# Patient Record
Sex: Male | Born: 1935 | Race: White | Hispanic: No | Marital: Married | Smoking: Former smoker
Health system: Southern US, Community
[De-identification: ages and names within clinical notes are randomized; demographics above are authoritative.]

## PROBLEM LIST (undated history)

## (undated) DIAGNOSIS — C801 Malignant (primary) neoplasm, unspecified: Secondary | ICD-10-CM

## (undated) DIAGNOSIS — I10 Essential (primary) hypertension: Secondary | ICD-10-CM

## (undated) DIAGNOSIS — H9190 Unspecified hearing loss, unspecified ear: Secondary | ICD-10-CM

## (undated) DIAGNOSIS — G259 Extrapyramidal and movement disorder, unspecified: Secondary | ICD-10-CM

## (undated) DIAGNOSIS — H539 Unspecified visual disturbance: Secondary | ICD-10-CM

## (undated) DIAGNOSIS — E785 Hyperlipidemia, unspecified: Secondary | ICD-10-CM

## (undated) DIAGNOSIS — I639 Cerebral infarction, unspecified: Secondary | ICD-10-CM

## (undated) HISTORY — DX: Unspecified hearing loss, unspecified ear: H91.90

## (undated) HISTORY — DX: Extrapyramidal and movement disorder, unspecified: G25.9

## (undated) HISTORY — PX: AORTA SURGERY: SHX548

## (undated) HISTORY — DX: Unspecified visual disturbance: H53.9

## (undated) HISTORY — PX: HERNIA REPAIR: SHX51

---

## 2013-09-12 ENCOUNTER — Emergency Department (INDEPENDENT_AMBULATORY_CARE_PROVIDER_SITE_OTHER): Payer: Medicare HMO

## 2013-09-12 ENCOUNTER — Emergency Department
Admission: EM | Admit: 2013-09-12 | Discharge: 2013-09-12 | Disposition: A | Discharge status: Home / Self Care | Payer: Medicare HMO | Source: Home / Self Care | Attending: Family Medicine | Admitting: Family Medicine

## 2013-09-12 ENCOUNTER — Ambulatory Visit (INDEPENDENT_AMBULATORY_CARE_PROVIDER_SITE_OTHER): Payer: Medicare HMO | Admitting: Sports Medicine

## 2013-09-12 ENCOUNTER — Encounter: Payer: Self-pay | Admitting: Emergency Medicine

## 2013-09-12 DIAGNOSIS — M1711 Unilateral primary osteoarthritis, right knee: Secondary | ICD-10-CM | POA: Insufficient documentation

## 2013-09-12 DIAGNOSIS — S8391XA Sprain of unspecified site of right knee, initial encounter: Secondary | ICD-10-CM

## 2013-09-12 DIAGNOSIS — M1710 Unilateral primary osteoarthritis, unspecified knee: Secondary | ICD-10-CM

## 2013-09-12 DIAGNOSIS — M25469 Effusion, unspecified knee: Secondary | ICD-10-CM

## 2013-09-12 DIAGNOSIS — IMO0002 Reserved for concepts with insufficient information to code with codable children: Secondary | ICD-10-CM

## 2013-09-12 DIAGNOSIS — M7660 Achilles tendinitis, unspecified leg: Secondary | ICD-10-CM

## 2013-09-12 DIAGNOSIS — M7662 Achilles tendinitis, left leg: Secondary | ICD-10-CM

## 2013-09-12 DIAGNOSIS — M79609 Pain in unspecified limb: Secondary | ICD-10-CM

## 2013-09-12 HISTORY — DX: Hyperlipidemia, unspecified: E78.5

## 2013-09-12 HISTORY — DX: Malignant (primary) neoplasm, unspecified: C80.1

## 2013-09-12 HISTORY — DX: Cerebral infarction, unspecified: I63.9

## 2013-09-12 HISTORY — DX: Essential (primary) hypertension: I10

## 2013-09-12 MED ORDER — DICLOFENAC SODIUM 1 % TD GEL: 4.0000 g | Freq: Four times a day (QID) | TRANSDERMAL | Status: DC

## 2013-09-12 NOTE — ED Provider Notes (Signed)
CSN: [id number]     Arrival date & time 09/12/13  1107 History   First MD Initiated Contact with Patient 09/12/13 1125     Chief Complaint  Patient presents with  . Leg Pain      HPI Comments: Patient lost his balance arising from a chair two weeks ago, falling and twisting his right knee.  He has had persistent right knee pain despite wearing an elastic knee sleeve.  He has pain walking and climbing in/out of a car. One week ago his left foot slipped and he injured his left achilles tendon.  He has persistent pain in his posterior heel.  Patient is a 80 y.o. male presenting with knee pain and ankle pain. The history is provided by the patient and the spouse.  Knee Pain Location:  Knee Time since incident:  2 weeks Injury: yes   Mechanism of injury: fall   Fall:    Fall occurred: from a chair.   Impact surface:  Hard floor   Point of impact: right anterior knee. Knee location:  R knee Pain details:    Quality:  Aching   Radiates to:  Does not radiate   Severity:  Moderate   Onset quality:  Sudden   Duration:  2 weeks   Timing:  Constant   Progression:  Unchanged Chronicity:  New Dislocation: no   Prior injury to area:  No Worsened by:  Bearing weight Ineffective treatments: elastic knee sleeve. Associated symptoms: decreased ROM and stiffness   Associated symptoms: no back pain, no muscle weakness, no numbness and no swelling   Ankle Pain Lower extremity pain location: right posterior heel and achilles tendon. Time since incident:  1 week Injury: yes   Mechanism of injury comment:  Left foot slipped on ground Pain details:    Quality:  Aching   Radiates to: posterior left calf.   Severity:  Mild   Onset quality:  Sudden   Duration:  1 week   Timing:  Constant   Progression:  Unchanged Chronicity:  New Prior injury to area:  No Associated symptoms: decreased ROM and stiffness   Associated symptoms: no back pain, no muscle weakness, no numbness and no swelling      Past Medical History  Diagnosis Date  . Hypertension   . Hyperlipidemia   . Stroke   . Cancer    History reviewed. No pertinent past surgical history. Family History  Problem Relation Age of Onset  . Heart attack Father    History  Substance Use Topics  . Smoking status: Current Every Day Smoker -- 0.50 packs/day for 70 years    Types: Cigarettes  . Smokeless tobacco: Not on file  . Alcohol Use: Yes    Review of Systems  Musculoskeletal: Positive for stiffness. Negative for back pain.  All other systems reviewed and are negative.    Allergies  Review of patient's allergies indicates no known allergies.  Home Medications   Current Outpatient Rx  Name  Route  Sig  Dispense  Refill  . amLODipine (NORVASC) 5 MG tablet   Oral   Take 5 mg by mouth daily.         Marland Kitchen buPROPion (WELLBUTRIN) 100 MG tablet   Oral   Take 100 mg by mouth 2 (two) times daily.         Marland Kitchen lisinopril (PRINIVIL,ZESTRIL) 10 MG tablet   Oral   Take 10 mg by mouth daily.         Marland Kitchen  rosuvastatin (CRESTOR) 10 MG tablet   Oral   Take 10 mg by mouth daily.         . diclofenac sodium (VOLTAREN) 1 % GEL   Topical   Apply 4 g topically 4 (four) times daily. To affected joint.   400 g   11    BP 129/76  Pulse 92  Temp(Src) 97.4 F (36.3 C) (Oral)  Ht 5\' 7"  (1.702 m)  Wt 146 lb (66.225 kg)  BMI 22.86 kg/m2  SpO2 98% Physical Exam  Nursing note and vitals reviewed. Constitutional: He is oriented to person, place, and time. He appears well-developed and well-nourished. No distress.  HENT:  Head: Atraumatic.  Eyes: Conjunctivae are normal. Pupils are equal, round, and reactive to light.  Musculoskeletal:       Right knee: He exhibits swelling and bony tenderness. He exhibits normal range of motion, no effusion, no ecchymosis, no deformity, no laceration, no erythema, normal alignment, no LCL laxity, normal patellar mobility, normal meniscus and no MCL laxity. Tenderness found.  Lateral joint line tenderness noted.       Right ankle: He exhibits decreased range of motion. He exhibits no swelling, no ecchymosis, no deformity, no laceration and normal pulse. Tenderness. Achilles tendon exhibits pain.       Legs:      Feet:  Left ankle reveals tenderness to palpation over insertion of achilles tendon but no swelling or ecchymosis.  Achilles tendon is intact.  There is mild tenderness with palpation of the proximal soleus muscle.  Right knee has mild tenderness over the lateral joint line and MCL  Neurological: He is alert and oriented to person, place, and time.  Skin: Skin is warm and dry.    ED Course  Procedures      Imaging Review       Imaging Results        DG Knee Complete 4 Views Right (Final result)  Result time: 09/12/13 12:13:46    Final result by Rad Results In Interface (09/12/13 12:13:46)    Narrative:   CLINICAL DATA: Pain and tenderness right knee laterally, fell and twisted right knee 2 weeks ago  EXAM: RIGHT KNEE - COMPLETE 4+ VIEW  COMPARISON: None  FINDINGS: Mild osseous demineralization.  Diffuse joint space narrowing greatest at patellofemoral joint.  Small spur at cranial margin of patella.  Small knee joint effusion.  No acute fracture, dislocation, or bone destruction.  Scattered atherosclerotic calcifications.  IMPRESSION: Osseous demineralization with mild degenerative changes and a small knee joint effusion.  No acute abnormalities.   Electronically Signed By: Lavonia Dana M.D. On: 09/12/2013 12:13             DG Os Calcis Left (Final result)  Result time: 09/12/13 12:10:39    Final result by Rad Results In Interface (09/12/13 12:10:39)    Narrative:   CLINICAL DATA: Fall. Posterior heel pain.  EXAM: LEFT OS CALCIS - 2+ VIEW  COMPARISON: No comparison studies available.  FINDINGS: Two view exam of the calcaneus shows no fracture. There is no worrisome lytic or sclerotic osseous  abnormality. Overlying soft tissues are unremarkable.  IMPRESSION: No acute bony abnormality.   Electronically Signed By: Misty Stanley M.D. On: 09/12/2013 12:10        MDM   1. Right knee sprain   2. Right knee DJD   3. Tendonitis, Achilles, left   Patient would benefit for corticosteroid injection right knee.  Will refer to Dr. Aundria Mems for management, and  also treatment of his achilles tendonitis.    Kandra Nicolas, MD 09/16/13 1024

## 2013-09-12 NOTE — ED Notes (Signed)
Rt knee pain, left achilles tendon pain from a fall 2 weeks ago, now both legs are hurting all the time

## 2013-09-12 NOTE — Assessment & Plan Note (Signed)
Aspiration of gross hemarthrosis and injection as above. Voltaren gel. Return to see me in one month.

## 2013-09-12 NOTE — Progress Notes (Signed)
   Subjective:    I'm seeing this patient as a consultation for:  Dr. Assunta Found  CC: Right knee pain  HPI: This is a very pleasant 80 year old male, 2 weeks ago he took a misstep, twisted his right knee and developed immediate pain, swelling, and inability to bear weight. He was seen in urgent care, noted to have a moderate effusion, degenerative changes on x-ray I was called for further evaluation and definitive treatment. End is moderate, persistent, localized to the medial joint line, no mechanical symptoms.  Past medical history, Surgical history, Family history not pertinant except as noted below, Social history, Allergies, and medications have been entered into the medical record, reviewed, and no changes needed.   Review of Systems: No headache, visual changes, nausea, vomiting, diarrhea, constipation, dizziness, abdominal pain, skin rash, fevers, chills, night sweats, weight loss, swollen lymph nodes, body aches, joint swelling, muscle aches, chest pain, shortness of breath, mood changes, visual or auditory hallucinations.   Objective:   General: Well Developed, well nourished, and in no acute distress.  Neuro/Psych: Alert and oriented x3, extra-ocular muscles intact, able to move all 4 extremities, sensation grossly intact. Skin: Warm and dry, no rashes noted.  Respiratory: Not using accessory muscles, speaking in full sentences, trachea midline.  Cardiovascular: Pulses palpable, no extremity edema. Abdomen: Does not appear distended. Right Knee: There is discrete tenderness to palpation of the medial joint line with a palpable moderate effusion.  ROM full in flexion and extension and lower leg rotation. Ligaments with solid consistent endpoints including ACL, PCL, LCL, MCL. Negative Mcmurray's, Apley's, and Thessalonian tests. Non painful patellar compression. Patellar glide without crepitus. Patellar and quadriceps tendons unremarkable. Hamstring and quadriceps strength is normal.    Procedure: Real-time Ultrasound Guided aspiration/ Injection of right knee Device: GE Logiq E  Verbal informed consent obtained.  Time-out conducted.  Noted no overlying erythema, induration, or other signs of local infection.  Skin prepped in a sterile fashion.  Local anesthesia: Topical Ethyl chloride.  With sterile technique and under real time ultrasound guidance:  22-gauge needle advanced into the suprapatellar recess, approximately 10 cc of grossly bloody fluid was aspirated, syringe switched in 2 cc Kenalog 40, 4 cc lidocaine injected easily. Completed without difficulty  Pain immediately resolved suggesting accurate placement of the medication.  Advised to call if fevers/chills, erythema, induration, drainage, or persistent bleeding.  Images permanently stored and available for review in the ultrasound unit.  Impression: Technically successful ultrasound guided injection.  Impression and Recommendations:   This case required medical decision making of moderate complexity.

## 2013-10-10 ENCOUNTER — Ambulatory Visit (INDEPENDENT_AMBULATORY_CARE_PROVIDER_SITE_OTHER): Payer: Medicare HMO | Admitting: Sports Medicine

## 2013-10-10 ENCOUNTER — Encounter: Payer: Self-pay | Admitting: Sports Medicine

## 2013-10-10 VITALS — BP 128/67 | HR 77 | Ht 67.0 in | Wt 148.0 lb

## 2013-10-10 DIAGNOSIS — M1711 Unilateral primary osteoarthritis, right knee: Secondary | ICD-10-CM

## 2013-10-10 DIAGNOSIS — M1710 Unilateral primary osteoarthritis, unspecified knee: Secondary | ICD-10-CM

## 2013-10-10 DIAGNOSIS — IMO0002 Reserved for concepts with insufficient information to code with codable children: Secondary | ICD-10-CM

## 2013-10-10 NOTE — Progress Notes (Signed)
  Subjective:    CC: Follow up  HPI: Right knee pain: Approximately a month ago this young man twisted, and had immediate pain and swelling in the knee. He aspirated some blood and injected his knee. He is almost 100% better but it's only occasional pain with twisting motions. Pain is localized on the medial joint line and over the medial femoral condyle.  Past medical history, Surgical history, Family history not pertinant except as noted below, Social history, Allergies, and medications have been entered into the medical record, reviewed, and no changes needed.   Review of Systems: No fevers, chills, night sweats, weight loss, chest pain, or shortness of breath.   Objective:    General: Well Developed, well nourished, and in no acute distress.  Neuro: Alert and oriented x3, extra-ocular muscles intact, sensation grossly intact.  HEENT: Normocephalic, atraumatic, pupils equal round reactive to light, neck supple, no masses, no lymphadenopathy, thyroid nonpalpable.  Skin: Warm and dry, no rashes. Cardiac: Regular rate and rhythm, no murmurs rubs or gallops, no lower extremity edema.  Respiratory: Clear to auscultation bilaterally. Not using accessory muscles, speaking in full sentences. Right Knee: Normal to inspection with no erythema or effusion or obvious bony abnormalities. Only minimally tender to palpation over the medial joint line.  ROM full in flexion and extension and lower leg rotation. Ligaments with solid consistent endpoints including ACL, PCL, LCL, MCL. Negative Mcmurray's, Apley's, and Thessalonian tests. Non painful patellar compression. Patellar glide without crepitus. Patellar and quadriceps tendons unremarkable. Hamstring and quadriceps strength is normal.   Procedure: Real-time Ultrasound Guided Injection of right knee Device: GE Logiq E  Verbal informed consent obtained.  Time-out conducted.  Noted no overlying erythema, induration, or other signs of local  infection.  Skin prepped in a sterile fashion.  Local anesthesia: Topical Ethyl chloride.  With sterile technique and under real time ultrasound guidance:  1 cc Kenalog 40, 4 cc lidocaine injected into the suprapatellar recess, syringe switched and 25 mg/2.5 mL of Supartz (sodium hyaluronate) in a prefilled syringe was injected easily into the knee through a 22-gauge needle. Completed without difficulty  Pain immediately resolved suggesting accurate placement of the medication.  Advised to call if fevers/chills, erythema, induration, drainage, or persistent bleeding.  Images permanently stored and available for review in the ultrasound unit.  Impression: Technically successful ultrasound guided injection.  Impression and Recommendations:

## 2013-10-10 NOTE — Assessment & Plan Note (Signed)
Overall doing well, does get occasional twinges of pain with twisting suggestive of a degenerative meniscal tear. I do not think he is a fantastic operative candidate so we are going to proceed with Visco supplementation. Return in one week for Supartz injection #2 of 5.

## 2013-10-17 ENCOUNTER — Encounter: Payer: Self-pay | Admitting: Sports Medicine

## 2013-10-17 ENCOUNTER — Ambulatory Visit (INDEPENDENT_AMBULATORY_CARE_PROVIDER_SITE_OTHER): Payer: Medicare HMO | Admitting: Sports Medicine

## 2013-10-17 VITALS — BP 120/76 | HR 80 | Ht 67.0 in | Wt 144.0 lb

## 2013-10-17 DIAGNOSIS — M1711 Unilateral primary osteoarthritis, right knee: Secondary | ICD-10-CM

## 2013-10-17 DIAGNOSIS — M1710 Unilateral primary osteoarthritis, unspecified knee: Secondary | ICD-10-CM

## 2013-10-17 DIAGNOSIS — IMO0002 Reserved for concepts with insufficient information to code with codable children: Secondary | ICD-10-CM

## 2013-10-17 NOTE — Assessment & Plan Note (Signed)
Completely pain-free, injection #2 given to the right knee. Return in one week for injection #3.

## 2013-10-17 NOTE — Progress Notes (Signed)
  Procedure: Real-time Ultrasound Guided Injection of right knee Device: GE Logiq E  Verbal informed consent obtained.  Time-out conducted.  Noted no overlying erythema, induration, or other signs of local infection.  Skin prepped in a sterile fashion.  Local anesthesia: Topical Ethyl chloride.  With sterile technique and under real time ultrasound guidance:  25 mg/2.5 mL of Supartz (sodium hyaluronate) in a prefilled syringe was injected easily into the knee through a 22-gauge needle.  Completed without difficulty  Pain immediately resolved suggesting accurate placement of the medication.  Advised to call if fevers/chills, erythema, induration, drainage, or persistent bleeding.  Images permanently stored and available for review in the ultrasound unit.  Impression: Technically successful ultrasound guided injection. 

## 2013-10-24 ENCOUNTER — Ambulatory Visit (INDEPENDENT_AMBULATORY_CARE_PROVIDER_SITE_OTHER): Payer: Medicare HMO | Admitting: Sports Medicine

## 2013-10-24 ENCOUNTER — Encounter: Payer: Self-pay | Admitting: Sports Medicine

## 2013-10-24 VITALS — BP 135/68 | HR 84 | Ht 67.0 in | Wt 148.0 lb

## 2013-10-24 DIAGNOSIS — M1710 Unilateral primary osteoarthritis, unspecified knee: Secondary | ICD-10-CM

## 2013-10-24 DIAGNOSIS — IMO0002 Reserved for concepts with insufficient information to code with codable children: Secondary | ICD-10-CM

## 2013-10-24 DIAGNOSIS — M1711 Unilateral primary osteoarthritis, right knee: Secondary | ICD-10-CM

## 2013-10-24 NOTE — Progress Notes (Signed)
   Procedure: Real-time Ultrasound Guided Injection of right knee Device: GE Logiq E  Verbal informed consent obtained.  Time-out conducted.  Noted no overlying erythema, induration, or other signs of local infection.  Skin prepped in a sterile fashion.  Local anesthesia: Topical Ethyl chloride.  With sterile technique and under real time ultrasound guidance:  25 mg/2.5 mL of Supartz (sodium hyaluronate) in a prefilled syringe was injected easily into the knee through a 22-gauge needle.  Completed without difficulty  Pain immediately resolved suggesting accurate placement of the medication.  Advised to call if fevers/chills, erythema, induration, drainage, or persistent bleeding.  Images permanently stored and available for review in the ultrasound unit.  Impression: Technically successful ultrasound guided injection. 

## 2013-10-24 NOTE — Assessment & Plan Note (Signed)
Supartz injection #3 of 5 given today, pain-free most days. Return in one week for injection #4.

## 2013-10-31 ENCOUNTER — Encounter: Payer: Self-pay | Admitting: Sports Medicine

## 2013-10-31 ENCOUNTER — Ambulatory Visit (INDEPENDENT_AMBULATORY_CARE_PROVIDER_SITE_OTHER): Payer: Medicare HMO | Admitting: Sports Medicine

## 2013-10-31 VITALS — BP 149/78 | HR 82 | Ht 67.0 in | Wt 148.0 lb

## 2013-10-31 DIAGNOSIS — M1710 Unilateral primary osteoarthritis, unspecified knee: Secondary | ICD-10-CM

## 2013-10-31 DIAGNOSIS — IMO0002 Reserved for concepts with insufficient information to code with codable children: Secondary | ICD-10-CM

## 2013-10-31 DIAGNOSIS — M1711 Unilateral primary osteoarthritis, right knee: Secondary | ICD-10-CM

## 2013-10-31 NOTE — Progress Notes (Signed)
    Procedure: Real-time Ultrasound Guided Injection of right knee Device: GE Logiq E  Verbal informed consent obtained.  Time-out conducted.  Noted no overlying erythema, induration, or other signs of local infection.  Skin prepped in a sterile fashion.  Local anesthesia: Topical Ethyl chloride.  With sterile technique and under real time ultrasound guidance:  25 mg/2.5 mL of Supartz (sodium hyaluronate) in a prefilled syringe was injected easily into the knee through a 22-gauge needle.  Completed without difficulty  Pain immediately resolved suggesting accurate placement of the medication.  Advised to call if fevers/chills, erythema, induration, drainage, or persistent bleeding.  Images permanently stored and available for review in the ultrasound unit.  Impression: Technically successful ultrasound guided injection. 

## 2013-10-31 NOTE — Assessment & Plan Note (Signed)
Completely pain free and tells [place] he is ready to run a marathon. Supartz injection #4 of 5 today into the right knee. Return in one week for injection #5.

## 2013-11-07 ENCOUNTER — Encounter: Payer: Self-pay | Admitting: Sports Medicine

## 2013-11-07 ENCOUNTER — Ambulatory Visit (INDEPENDENT_AMBULATORY_CARE_PROVIDER_SITE_OTHER): Payer: Medicare HMO | Admitting: Sports Medicine

## 2013-11-07 VITALS — BP 137/68 | HR 77 | Ht 67.0 in | Wt 148.0 lb

## 2013-11-07 DIAGNOSIS — IMO0002 Reserved for concepts with insufficient information to code with codable children: Secondary | ICD-10-CM

## 2013-11-07 DIAGNOSIS — M1710 Unilateral primary osteoarthritis, unspecified knee: Secondary | ICD-10-CM

## 2013-11-07 DIAGNOSIS — M1711 Unilateral primary osteoarthritis, right knee: Secondary | ICD-10-CM

## 2013-11-07 NOTE — Assessment & Plan Note (Signed)
Supartz injection #5 of 5 and to the right knee. Pain-free. Return as needed, we can repeat this up to every 6 months per Medicare rules.

## 2013-11-07 NOTE — Progress Notes (Signed)
     Procedure: Real-time Ultrasound Guided Injection of right knee Device: GE Logiq E  Verbal informed consent obtained.  Time-out conducted.  Noted no overlying erythema, induration, or other signs of local infection.  Skin prepped in a sterile fashion.  Local anesthesia: Topical Ethyl chloride.  With sterile technique and under real time ultrasound guidance:  25 mg/2.5 mL of Supartz (sodium hyaluronate) in a prefilled syringe was injected easily into the knee through a 22-gauge needle.  Completed without difficulty  Pain immediately resolved suggesting accurate placement of the medication.  Advised to call if fevers/chills, erythema, induration, drainage, or persistent bleeding.  Images permanently stored and available for review in the ultrasound unit.  Impression: Technically successful ultrasound guided injection. 

## 2013-11-29 ENCOUNTER — Ambulatory Visit (INDEPENDENT_AMBULATORY_CARE_PROVIDER_SITE_OTHER): Payer: Medicare HMO

## 2013-11-29 ENCOUNTER — Encounter: Payer: Medicare HMO | Admitting: Sports Medicine

## 2013-11-29 DIAGNOSIS — M25512 Pain in left shoulder: Secondary | ICD-10-CM | POA: Insufficient documentation

## 2013-11-29 DIAGNOSIS — M25519 Pain in unspecified shoulder: Secondary | ICD-10-CM

## 2013-11-29 NOTE — Progress Notes (Deleted)
   Subjective:    I'm seeing this patient as a consultation for:    CC:   HPI:  Past medical history, Surgical history, Family history not pertinant except as noted below, Social history, Allergies, and medications have been entered into the medical record, reviewed, and no changes needed.   Review of Systems: No headache, visual changes, nausea, vomiting, diarrhea, constipation, dizziness, abdominal pain, skin rash, fevers, chills, night sweats, weight loss, swollen lymph nodes, body aches, joint swelling, muscle aches, chest pain, shortness of breath, mood changes, visual or auditory hallucinations.   Objective:   General: Well Developed, well nourished, and in no acute distress.  Neuro/Psych: Alert and oriented x3, extra-ocular muscles intact, able to move all 4 extremities, sensation grossly intact. Skin: Warm and dry, no rashes noted.  Respiratory: Not using accessory muscles, speaking in full sentences, trachea midline.  Cardiovascular: Pulses palpable, no extremity edema. Abdomen: Does not appear distended.   Impression and Recommendations:   This case required medical decision making of moderate complexity.  

## 2013-11-30 ENCOUNTER — Ambulatory Visit (INDEPENDENT_AMBULATORY_CARE_PROVIDER_SITE_OTHER): Payer: Medicare HMO | Admitting: Sports Medicine

## 2013-11-30 ENCOUNTER — Encounter: Payer: Self-pay | Admitting: Sports Medicine

## 2013-11-30 VITALS — BP 124/68 | HR 83 | Wt 148.0 lb

## 2013-11-30 DIAGNOSIS — M25512 Pain in left shoulder: Secondary | ICD-10-CM

## 2013-11-30 DIAGNOSIS — M25519 Pain in unspecified shoulder: Secondary | ICD-10-CM

## 2013-11-30 NOTE — Progress Notes (Signed)
  Subjective:    CC:  Left shoulder pain  HPI: Henry Cruz is a pleasant 80 year old male, yesterday he fell, he had immediate pain to be localized over his deltoid. He tells me that today the pain is significantly better, without any treatment. He did come yesterday for x-rays but we did not see him. Pain is mild, improving.  Past medical history, Surgical history, Family history not pertinant except as noted below, Social history, Allergies, and medications have been entered into the medical record, reviewed, and no changes needed.   Review of Systems: No fevers, chills, night sweats, weight loss, chest pain, or shortness of breath.   Objective:    General: Well Developed, well nourished, and in no acute distress.  Neuro: Alert and oriented x3, extra-ocular muscles intact, sensation grossly intact.  HEENT: Normocephalic, atraumatic, pupils equal round reactive to light, neck supple, no masses, no lymphadenopathy, thyroid nonpalpable.  Skin: Warm and dry, no rashes. Cardiac: Regular rate and rhythm, no murmurs rubs or gallops, no lower extremity edema.  Respiratory: Clear to auscultation bilaterally. Not using accessory muscles, speaking in full sentences. Left Shoulder: Inspection reveals no abnormalities, atrophy or asymmetry. Palpation is normal with no tenderness over AC joint or bicipital groove. ROM is full in all planes. Rotator cuff strength normal throughout. Positive Neer's, Hawkins, and Empty Can signs. Speeds and Yergason's tests normal. No labral pathology noted with negative Obrien's, negative clunk and good stability. Normal scapular function observed. No painful arc and no drop arm sign. No apprehension sign  X-rays were personally reviewed, there are a.c. joint degenerative changes but otherwise negative, no sign of fracture.  Impression and Recommendations:

## 2013-11-30 NOTE — Progress Notes (Signed)
This encounter was created in error - please disregard.

## 2013-11-30 NOTE — Assessment & Plan Note (Signed)
Fell down recently, strained his shoulder. X-rays are negative, rotator cuff rehabilitation given, continue extra strength Tylenol as needed. Return 2 weeks, and he will likely be completely better.

## 2013-12-07 ENCOUNTER — Ambulatory Visit (INDEPENDENT_AMBULATORY_CARE_PROVIDER_SITE_OTHER): Payer: Medicare HMO | Admitting: Sports Medicine

## 2013-12-07 ENCOUNTER — Encounter: Payer: Self-pay | Admitting: Sports Medicine

## 2013-12-07 VITALS — BP 139/70 | HR 74 | Ht 67.0 in | Wt 150.0 lb

## 2013-12-07 DIAGNOSIS — M25519 Pain in unspecified shoulder: Secondary | ICD-10-CM

## 2013-12-07 DIAGNOSIS — M25512 Pain in left shoulder: Secondary | ICD-10-CM

## 2013-12-07 NOTE — Progress Notes (Signed)
  Subjective:    CC: Followup  HPI: Henry Cruz returns, he fell on his shoulder last week. X-rays were negative, he returns today essentially pain-free with full range of motion.  Past medical history, Surgical history, Family history not pertinant except as noted below, Social history, Allergies, and medications have been entered into the medical record, reviewed, and no changes needed.   Review of Systems: No fevers, chills, night sweats, weight loss, chest pain, or shortness of breath.   Objective:    General: Well Developed, well nourished, and in no acute distress.  Neuro: Alert and oriented x3, extra-ocular muscles intact, sensation grossly intact.  HEENT: Normocephalic, atraumatic, pupils equal round reactive to light, neck supple, no masses, no lymphadenopathy, thyroid nonpalpable.  Skin: Warm and dry, no rashes. Cardiac: Regular rate and rhythm, no murmurs rubs or gallops, no lower extremity edema.  Respiratory: Clear to auscultation bilaterally. Not using accessory muscles, speaking in full sentences. Left Shoulder: Inspection reveals no abnormalities, atrophy or asymmetry. Palpation is normal with no tenderness over AC joint or bicipital groove. ROM is full in all planes. Rotator cuff strength normal throughout. No signs of impingement with negative Neer and Hawkin's tests, empty can sign. Speeds and Yergason's tests normal. No labral pathology noted with negative Obrien's, negative clunk and good stability. Normal scapular function observed. No painful arc and no drop arm sign. No apprehension sign  Impression and Recommendations:

## 2013-12-07 NOTE — Assessment & Plan Note (Signed)
Completely resolved with relative rest. Return as needed.

## 2013-12-14 ENCOUNTER — Ambulatory Visit: Payer: Medicare HMO | Admitting: Sports Medicine

## 2014-01-14 ENCOUNTER — Encounter: Payer: Self-pay | Admitting: Emergency Medicine

## 2014-01-14 ENCOUNTER — Emergency Department
Admission: EM | Admit: 2014-01-14 | Discharge: 2014-01-14 | Disposition: A | Discharge status: Home / Self Care | Payer: Medicare HMO | Source: Home / Self Care | Attending: Family Medicine | Admitting: Family Medicine

## 2014-01-14 DIAGNOSIS — S40812A Abrasion of left upper arm, initial encounter: Secondary | ICD-10-CM

## 2014-01-14 DIAGNOSIS — IMO0002 Reserved for concepts with insufficient information to code with codable children: Secondary | ICD-10-CM

## 2014-01-14 MED ORDER — CEPHALEXIN 500 MG PO CAPS: 500.0000 mg | ORAL_CAPSULE | Freq: Two times a day (BID) | ORAL | Status: DC

## 2014-01-14 NOTE — Discharge Instructions (Signed)
Leave present dressing in place until follow-up visit.  Keep wound clean and dry.  Check with family doctor for status of tetanus immunization.  Return earlier for worsening symptoms:  Pain, swelling, fever, etc.   Abrasion An abrasion is a cut or scrape of the skin. Abrasions do not extend through all layers of the skin and most heal within 10 days. It is important to care for your abrasion properly to prevent infection. CAUSES  Most abrasions are caused by falling on, or gliding across, the ground or other surface. When your skin rubs on something, the outer and inner layer of skin rubs off, causing an abrasion. DIAGNOSIS  Your caregiver will be able to diagnose an abrasion during a physical exam.  TREATMENT  Your treatment depends on how large and deep the abrasion is. Generally, your abrasion will be cleaned with water and a mild soap to remove any dirt or debris. An antibiotic ointment may be put over the abrasion to prevent an infection. A bandage (dressing) may be wrapped around the abrasion to keep it from getting dirty.  You may need a tetanus shot if:  You cannot remember when you had your last tetanus shot.  You have never had a tetanus shot.  The injury broke your skin. If you get a tetanus shot, your arm may swell, get red, and feel warm to the touch. This is common and not a problem. If you need a tetanus shot and you choose not to have one, there is a rare chance of getting tetanus. Sickness from tetanus can be serious.  HOME CARE INSTRUCTIONS .   Only take over-the-counter or prescription medicines for pain, discomfort, or fever as directed by your caregiver.   Follow up with your caregiver for a wound check, or as directed. If you were not given a wound-check appointment, look closely at your abrasion for redness, swelling, or pus. These are signs of infection. SEEK IMMEDIATE MEDICAL CARE IF:   You have increasing pain in the wound.   You have redness, swelling, or  tenderness around the wound.   You have pus coming from the wound.   You have a fever or persistent symptoms for more than 2 3 days.  You have a fever and your symptoms suddenly get worse.  You have a bad smell coming from the wound or dressing.  MAKE SURE YOU:   Understand these instructions.  Will watch your condition.  Will get help right away if you are not doing well or get worse. Document Released: 05/05/2005 Document Revised: 07/12/2012 Document Reviewed: 06/29/2011 [facility] Patient Information 2014 [place], [place].

## 2014-01-14 NOTE — ED Notes (Addendum)
Fell getting up off bed striking arm on object.  Sustained abrasion on most of lateral L forearm. Removed bandages from arm leaving dressings intact.

## 2014-01-14 NOTE — ED Provider Notes (Signed)
CSN: [id number]     Arrival date & time 01/14/14  1431 History   First MD Initiated Contact with Patient 01/14/14 1535     Chief Complaint  Patient presents with  . Arm Injury     HPI Comments: Patient tripped on his bedroom floor two days ago and fell on his left arm resulting in abrasions.  He visited an urgent care center yesterday where the wounds were cleaned and dressed with Xeroform.  He believes that his tetanus immunization is current.  Patient is a 80 y.o. male presenting with arm injury. The history is provided by the patient and the spouse.  Arm Injury Location:  Arm Time since incident:  2 days Injury: yes   Mechanism of injury: fall   Fall:    Impact surface:  Hard floor   Point of impact: left arm. Pain details:    Quality:  Aching   Severity:  Mild   Onset quality:  Sudden   Duration:  2 days   Progression:  Improving Dislocation: no   Prior injury to area:  No Associated symptoms: no decreased range of motion, no muscle weakness, no numbness, no stiffness, no swelling and no tingling     Past Medical History  Diagnosis Date  . Hypertension   . Hyperlipidemia   . Stroke   . Cancer    Past Surgical History  Procedure Laterality Date  . Hernia repair    . Aorta surgery     Family History  Problem Relation Age of Onset  . Heart attack Father    History  Substance Use Topics  . Smoking status: Current Every Day Smoker -- 0.50 packs/day for 70 years    Types: Cigarettes  . Smokeless tobacco: Not on file  . Alcohol Use: Yes    Review of Systems  Musculoskeletal: Negative for stiffness.  All other systems reviewed and are negative.   Allergies  Erythromycin  Home Medications   Prior to Admission medications   Medication Sig Start Date End Date Taking? Authorizing Provider  Acetaminophen (PAIN RELIEVER EXTRA STRENGTH PO) Take 2 tablets by mouth daily.    Historical Provider, MD  amLODipine (NORVASC) 5 MG tablet Take 5 mg by mouth daily.     Historical Provider, MD  buPROPion (WELLBUTRIN) 100 MG tablet Take 100 mg by mouth 2 (two) times daily.    Historical Provider, MD  cephALEXin (KEFLEX) 500 MG capsule Take 1 capsule (500 mg total) by mouth 2 (two) times daily. 01/14/14   Kandra Nicolas, MD  diclofenac sodium (VOLTAREN) 1 % GEL Apply 4 g topically 4 (four) times daily. To affected joint. 09/12/13   Silverio Decamp, MD  lisinopril (PRINIVIL,ZESTRIL) 10 MG tablet Take 10 mg by mouth daily.    Historical Provider, MD  Multiple Vitamins-Minerals (CENTRUM SILVER PO) Take 1 tablet by mouth daily.    Historical Provider, MD  rosuvastatin (CRESTOR) 10 MG tablet Take 10 mg by mouth daily.    Historical Provider, MD   BP 184/77  Pulse 69  Temp(Src) 98.1 F (36.7 C) (Oral)  Ht 5\' 7"  (1.702 m)  Wt 147 lb 12 oz (67.019 kg)  BMI 23.14 kg/m2  SpO2 98% Physical Exam  Nursing note and vitals reviewed. Constitutional: He is oriented to person, place, and time. He appears well-developed and well-nourished. No distress.  HENT:  Head: Atraumatic.  Eyes: Conjunctivae are normal. Pupils are equal, round, and reactive to light.  Cardiovascular: Normal heart sounds.   Pulmonary/Chest: Breath  sounds normal.  Neurological: He is alert and oriented to person, place, and time.  Skin: Skin is dry. Abrasion and ecchymosis noted. No erythema.     Left forearm and elbow has three abrasions/skin tears as noted on diagram with Xeroform dressings in place.  Dressings removed and no swelling or drainage noted.  Minimal tenderness to palpation.  Distal neurovascular function is intact.     ED Course  Procedures  none         MDM   1. Abrasion of left arm    Abrasions left arm dressed with three Mepetel Lite 10cm dressings. Begin Keflex. Advised to leave dressing in place until follow-up visit.  Keep wound clean and dry.  Check with family doctor for status of tetanus immunization.  Return earlier for worsening symptoms:  Pain, swelling,  fever, etc. Followup here or with PCP in 3 days.    Kandra Nicolas, MD 01/16/14 2154

## 2014-01-18 ENCOUNTER — Telehealth: Payer: Self-pay | Admitting: Emergency Medicine

## 2014-02-28 ENCOUNTER — Emergency Department
Admission: EM | Admit: 2014-02-28 | Discharge: 2014-02-28 | Disposition: A | Discharge status: Home / Self Care | Payer: Medicare HMO | Source: Home / Self Care | Attending: Emergency Medicine | Admitting: Emergency Medicine

## 2014-02-28 ENCOUNTER — Encounter: Payer: Self-pay | Admitting: Emergency Medicine

## 2014-02-28 ENCOUNTER — Emergency Department (INDEPENDENT_AMBULATORY_CARE_PROVIDER_SITE_OTHER): Payer: Medicare HMO

## 2014-02-28 DIAGNOSIS — S2239XA Fracture of one rib, unspecified side, initial encounter for closed fracture: Secondary | ICD-10-CM

## 2014-02-28 DIAGNOSIS — W19XXXA Unspecified fall, initial encounter: Secondary | ICD-10-CM

## 2014-02-28 DIAGNOSIS — S40011A Contusion of right shoulder, initial encounter: Secondary | ICD-10-CM

## 2014-02-28 DIAGNOSIS — S40019A Contusion of unspecified shoulder, initial encounter: Secondary | ICD-10-CM

## 2014-02-28 DIAGNOSIS — S2231XA Fracture of one rib, right side, initial encounter for closed fracture: Secondary | ICD-10-CM

## 2014-02-28 MED ORDER — IBUPROFEN 200 MG PO TABS: ORAL_TABLET | ORAL | Status: DC

## 2014-02-28 MED ORDER — HYDROCODONE-ACETAMINOPHEN 5-325 MG PO TABS: 1.0000 | ORAL_TABLET | ORAL | Status: DC | PRN

## 2014-02-28 NOTE — ED Provider Notes (Signed)
CSN: [id number]     Arrival date & time 02/28/14  1642 History   First MD Initiated Contact with Patient 02/28/14 1706     Chief Complaint  Patient presents with  . Chest Pain  . Shoulder Pain    HPI Here with wife. Patient accidentally fell yesterday, injuring his right side., and today is sore on right side of ribs both anterior and posterior, as well as right shoulder. --Denies any loss of consciousness or focal neurologic symptoms. No weakness or paresthesias.  Ambulatory. Has taken acetominophen and ibuprofen, which helps a little-And use the topical pad for heat, all of these measures minimally effective to relieve pain.  The right chest and rib and shoulder pain is sharp. Pleuritic. Worse to move or twist. No radiation down the arm. Pain intensity 8-9/10.  Denies any new exertional shortness of breath. He has stable COPD. Denies exertional chest pain. No abdominal or GI symptoms.  Past Medical History  Diagnosis Date  . Hypertension   . Hyperlipidemia   . Stroke   . Cancer    Past Surgical History  Procedure Laterality Date  . Hernia repair    . Aorta surgery     Family History  Problem Relation Age of Onset  . Heart attack Father    History  Substance Use Topics  . Smoking status: Current Every Day Smoker -- 0.50 packs/day for 70 years    Types: Cigarettes  . Smokeless tobacco: Not on file  . Alcohol Use: Yes    Review of Systems  All other systems reviewed and are negative.   Allergies  Erythromycin  Home Medications   Prior to Admission medications   Medication Sig Start Date End Date Taking? Authorizing Provider  Acetaminophen (PAIN RELIEVER EXTRA STRENGTH PO) Take 2 tablets by mouth daily.    Historical Provider, MD  amLODipine (NORVASC) 5 MG tablet Take 5 mg by mouth daily.    Historical Provider, MD  buPROPion (WELLBUTRIN) 100 MG tablet Take 100 mg by mouth 2 (two) times daily.    Historical Provider, MD  cephALEXin (KEFLEX) 500 MG capsule Take 1  capsule (500 mg total) by mouth 2 (two) times daily. 01/14/14   Kandra Nicolas, MD  diclofenac sodium (VOLTAREN) 1 % GEL Apply 4 g topically 4 (four) times daily. To affected joint. 09/12/13   Silverio Decamp, MD  HYDROcodone-acetaminophen (NORCO/VICODIN) 5-325 MG per tablet Take 1 tablet by mouth every 4 (four) hours as needed for severe pain. Take with food. Caution: May cause drowsiness 02/28/14   Jacqulyn Cane, MD  lisinopril (PRINIVIL,ZESTRIL) 10 MG tablet Take 10 mg by mouth daily.    Historical Provider, MD  Multiple Vitamins-Minerals (CENTRUM SILVER PO) Take 1 tablet by mouth daily.    Historical Provider, MD  rosuvastatin (CRESTOR) 10 MG tablet Take 10 mg by mouth daily.    Historical Provider, MD   BP 148/72  Pulse 73  Temp(Src) 98.3 F (36.8 C) (Oral)  Resp 16  Ht 5\' 7"  (1.702 m)  Wt 144 lb (65.318 kg)  BMI 22.55 kg/m2  SpO2 95% Physical Exam  Nursing note and vitals reviewed. Constitutional: He is oriented to person, place, and time. He appears well-developed and well-nourished. No distress.  HENT:  Head: Normocephalic and atraumatic.  Eyes: Conjunctivae and EOM are normal. Pupils are equal, round, and reactive to light. No scleral icterus.  Neck: Normal range of motion. No JVD present.  Cardiovascular: Normal rate, regular rhythm and normal heart sounds.  Exam  reveals no gallop and no friction rub.   No murmur heard. Pulmonary/Chest: Effort normal. No respiratory distress. He has no wheezes. He has no rales. He exhibits tenderness.  Mild diffuse rhonchi, consistent with his baseline COPD. Breath sounds equal bilaterally.   Abdominal: He exhibits no distension. There is no tenderness.  Musculoskeletal: Normal range of motion.       Right shoulder: He exhibits bony tenderness. He exhibits normal range of motion, no swelling, no deformity, no laceration, normal pulse and normal strength.       Cervical back: He exhibits no tenderness and no bony tenderness.        Arms: Neurological: He is alert and oriented to person, place, and time. He has normal strength. No sensory deficit.  Skin: Skin is warm. No bruising noted. He is not diaphoretic.  Psychiatric: He has a normal mood and affect.   No calf pain or swelling or tenderness ED Course  Procedures (including critical care time) Labs Review Labs Reviewed - No data to display  Imaging Review Dg Ribs Unilateral W/chest Right  02/28/2014   CLINICAL DATA:  Lower right posterior chest pain after falling yesterday.  EXAM: RIGHT RIBS AND CHEST - 3+ VIEW  COMPARISON:  Chest radiographs 07/12/2011.  Abdominal CT 02/13/2010.  FINDINGS: The heart size and mediastinal contours are normal. The lungs appear clear. There is no pleural effusion or pneumothorax.  The bones are demineralized. No acute lower right-sided rib fractures are demonstrated. There is a fracture of the right fourth rib and posteriorly which appears mildly displaced. Within the right abdomen, there is a 9.3 cm peripherally calcified mass, corresponding with a peripherally calcified cyst on CT.  IMPRESSION: Mildly displaced fracture of the right fourth rib posteriorly, new from 2012 and potentially acute. No lower rib fractures identified. No evidence of pleural effusion or pneumothorax.   Electronically Signed   By: Camie Patience M.D.   On: 02/28/2014 18:10   Dg Shoulder Right  02/28/2014   CLINICAL DATA:  Right shoulder pain status post fall.  EXAM: RIGHT SHOULDER - 2+ VIEW  COMPARISON:  Chest x-ray and rib detail series of today's date.  FINDINGS: The bones are osteopenic. There is no acute fracture. There is degenerative change of the bony glenoid. The observed portions of the left clavicle are intact. There are fractures of the posterior aspects of the third and fourth right ribs. No pneumothorax is demonstrated.  IMPRESSION: There is no acute fracture of the shoulder but there are fractures of at least the third and fourth ribs posteriorly.    Electronically Signed   By: David  Martinique   On: 02/28/2014 18:09     MDM   1. Fractured rib, right, closed, initial encounter   2. Contusion of right shoulder, initial encounter     X-ray right ribs: + Fractures of posterior aspects of right third and fourth ribs X-ray right shoulder: No acute fracture or dislocation of the right shoulder  Treatment options discussed, as well as risks, benefits, alternatives. Patient and wife voiced understanding and agreement with the following plans: Ice, rest Vicodin, 1 every 4-6 hours when necessary severe pain, but precautions discussed. Ibuprofen when necessary mild to moderate pain  rib belt and right shoulder immobilizer--these were applied and reduced his pain level. Follow-up with your primary care doctor or orthopedist in 5-7 days if not improving, or sooner if symptoms become worse. Precautions discussed. Red flags discussed. Questions invited and answered. They voiced understanding and agreement.  Over  45 minutes spent, greater than 50% of the time spent for counseling and coordination of care.     Jacqulyn Cane, MD 02/28/14 2106

## 2014-02-28 NOTE — ED Notes (Signed)
Patient fell yesterday and today is sore on right side of ribs both anterior and posterior, as well as right shoulder.  Ambulatory. Has taken acetominophen and ibuprofen.

## 2014-04-07 DIAGNOSIS — G8190 Hemiplegia, unspecified affecting unspecified side: Secondary | ICD-10-CM | POA: Insufficient documentation

## 2014-04-07 DIAGNOSIS — R260 Ataxic gait: Secondary | ICD-10-CM | POA: Insufficient documentation

## 2014-04-07 DIAGNOSIS — I6990 Unspecified sequelae of unspecified cerebrovascular disease: Secondary | ICD-10-CM | POA: Insufficient documentation

## 2014-04-07 DIAGNOSIS — R258 Other abnormal involuntary movements: Secondary | ICD-10-CM

## 2014-04-07 DIAGNOSIS — R251 Tremor, unspecified: Secondary | ICD-10-CM | POA: Insufficient documentation

## 2014-06-21 ENCOUNTER — Emergency Department (HOSPITAL_COMMUNITY): Payer: Medicare HMO

## 2014-06-21 ENCOUNTER — Encounter (HOSPITAL_COMMUNITY): Payer: Self-pay

## 2014-06-21 ENCOUNTER — Inpatient Hospital Stay (HOSPITAL_COMMUNITY)
Admission: EM | Admit: 2014-06-21 | Discharge: 2014-06-25 | DRG: 535 | Disposition: A | Discharge status: Skilled Nursing Facility | Payer: Medicare HMO | Attending: Internal Medicine | Admitting: Internal Medicine

## 2014-06-21 ENCOUNTER — Ambulatory Visit (INDEPENDENT_AMBULATORY_CARE_PROVIDER_SITE_OTHER): Payer: Medicare HMO

## 2014-06-21 ENCOUNTER — Inpatient Hospital Stay (HOSPITAL_COMMUNITY): Payer: Medicare HMO

## 2014-06-21 ENCOUNTER — Ambulatory Visit (INDEPENDENT_AMBULATORY_CARE_PROVIDER_SITE_OTHER): Payer: Medicare HMO | Admitting: Sports Medicine

## 2014-06-21 DIAGNOSIS — S32402A Unspecified fracture of left acetabulum, initial encounter for closed fracture: Secondary | ICD-10-CM

## 2014-06-21 DIAGNOSIS — W06XXXA Fall from bed, initial encounter: Secondary | ICD-10-CM

## 2014-06-21 DIAGNOSIS — J209 Acute bronchitis, unspecified: Secondary | ICD-10-CM | POA: Diagnosis present

## 2014-06-21 DIAGNOSIS — J440 Chronic obstructive pulmonary disease with acute lower respiratory infection: Secondary | ICD-10-CM | POA: Diagnosis present

## 2014-06-21 DIAGNOSIS — S32409A Unspecified fracture of unspecified acetabulum, initial encounter for closed fracture: Secondary | ICD-10-CM | POA: Insufficient documentation

## 2014-06-21 DIAGNOSIS — M160 Bilateral primary osteoarthritis of hip: Secondary | ICD-10-CM

## 2014-06-21 DIAGNOSIS — J690 Pneumonitis due to inhalation of food and vomit: Secondary | ICD-10-CM | POA: Diagnosis present

## 2014-06-21 DIAGNOSIS — Z79899 Other long term (current) drug therapy: Secondary | ICD-10-CM | POA: Diagnosis not present

## 2014-06-21 DIAGNOSIS — I10 Essential (primary) hypertension: Secondary | ICD-10-CM | POA: Diagnosis present

## 2014-06-21 DIAGNOSIS — W010XXA Fall on same level from slipping, tripping and stumbling without subsequent striking against object, initial encounter: Secondary | ICD-10-CM | POA: Diagnosis present

## 2014-06-21 DIAGNOSIS — S51012A Laceration without foreign body of left elbow, initial encounter: Secondary | ICD-10-CM | POA: Diagnosis present

## 2014-06-21 DIAGNOSIS — S72009A Fracture of unspecified part of neck of unspecified femur, initial encounter for closed fracture: Secondary | ICD-10-CM | POA: Insufficient documentation

## 2014-06-21 DIAGNOSIS — E785 Hyperlipidemia, unspecified: Secondary | ICD-10-CM | POA: Diagnosis present

## 2014-06-21 DIAGNOSIS — J40 Bronchitis, not specified as acute or chronic: Secondary | ICD-10-CM

## 2014-06-21 DIAGNOSIS — M25552 Pain in left hip: Secondary | ICD-10-CM | POA: Diagnosis not present

## 2014-06-21 DIAGNOSIS — Z720 Tobacco use: Secondary | ICD-10-CM

## 2014-06-21 DIAGNOSIS — Z791 Long term (current) use of non-steroidal anti-inflammatories (NSAID): Secondary | ICD-10-CM

## 2014-06-21 DIAGNOSIS — J9601 Acute respiratory failure with hypoxia: Secondary | ICD-10-CM | POA: Diagnosis present

## 2014-06-21 DIAGNOSIS — F17200 Nicotine dependence, unspecified, uncomplicated: Secondary | ICD-10-CM | POA: Diagnosis present

## 2014-06-21 DIAGNOSIS — F17210 Nicotine dependence, cigarettes, uncomplicated: Secondary | ICD-10-CM | POA: Diagnosis present

## 2014-06-21 DIAGNOSIS — Y92009 Unspecified place in unspecified non-institutional (private) residence as the place of occurrence of the external cause: Secondary | ICD-10-CM | POA: Diagnosis not present

## 2014-06-21 DIAGNOSIS — Z66 Do not resuscitate: Secondary | ICD-10-CM | POA: Diagnosis present

## 2014-06-21 DIAGNOSIS — J449 Chronic obstructive pulmonary disease, unspecified: Secondary | ICD-10-CM | POA: Diagnosis present

## 2014-06-21 DIAGNOSIS — S32492A Other specified fracture of left acetabulum, initial encounter for closed fracture: Secondary | ICD-10-CM | POA: Diagnosis present

## 2014-06-21 DIAGNOSIS — Z8673 Personal history of transient ischemic attack (TIA), and cerebral infarction without residual deficits: Secondary | ICD-10-CM

## 2014-06-21 DIAGNOSIS — F05 Delirium due to known physiological condition: Secondary | ICD-10-CM | POA: Diagnosis present

## 2014-06-21 DIAGNOSIS — R1313 Dysphagia, pharyngeal phase: Secondary | ICD-10-CM | POA: Diagnosis present

## 2014-06-21 DIAGNOSIS — F0390 Unspecified dementia without behavioral disturbance: Secondary | ICD-10-CM | POA: Diagnosis present

## 2014-06-21 DIAGNOSIS — J189 Pneumonia, unspecified organism: Secondary | ICD-10-CM

## 2014-06-21 DIAGNOSIS — R296 Repeated falls: Secondary | ICD-10-CM | POA: Diagnosis present

## 2014-06-21 DIAGNOSIS — W19XXXA Unspecified fall, initial encounter: Secondary | ICD-10-CM

## 2014-06-21 DIAGNOSIS — G9340 Encephalopathy, unspecified: Secondary | ICD-10-CM | POA: Diagnosis present

## 2014-06-21 LAB — CBC WITH DIFFERENTIAL/PLATELET
Basophils Absolute: 0 10*3/uL (ref 0.0–0.1)
Basophils Relative: 1 % (ref 0–1)
Eosinophils Absolute: 0.2 10*3/uL (ref 0.0–0.7)
Eosinophils Relative: 2 % (ref 0–5)
HCT: 35.5 % — ABNORMAL LOW (ref 39.0–52.0)
HEMOGLOBIN: 11.4 g/dL — AB (ref 13.0–17.0)
LYMPHS ABS: 0.5 10*3/uL — AB (ref 0.7–4.0)
Lymphocytes Relative: 6 % — ABNORMAL LOW (ref 12–46)
MCH: 30.1 pg (ref 26.0–34.0)
MCHC: 32.1 g/dL (ref 30.0–36.0)
MCV: 93.7 fL (ref 78.0–100.0)
MONOS PCT: 7 % (ref 3–12)
Monocytes Absolute: 0.6 10*3/uL (ref 0.1–1.0)
NEUTROS PCT: 84 % — AB (ref 43–77)
Neutro Abs: 7 10*3/uL (ref 1.7–7.7)
PLATELETS: 213 10*3/uL (ref 150–400)
RBC: 3.79 MIL/uL — AB (ref 4.22–5.81)
RDW: 14.1 % (ref 11.5–15.5)
WBC: 8.2 10*3/uL (ref 4.0–10.5)

## 2014-06-21 LAB — PROTIME-INR
INR: 1.01 (ref 0.00–1.49)
Prothrombin Time: 13.4 seconds (ref 11.6–15.2)

## 2014-06-21 LAB — BLOOD GAS, ARTERIAL
ACID-BASE DEFICIT: 5.9 mmol/L — AB (ref 0.0–2.0)
Bicarbonate: 18.5 mEq/L — ABNORMAL LOW (ref 20.0–24.0)
Drawn by: 404181
FIO2: 0.32 %
O2 Saturation: 93.5 %
Patient temperature: 98.6
TCO2: 19.5 mmol/L (ref 0–100)
pCO2 arterial: 33 mmHg — ABNORMAL LOW (ref 35.0–45.0)
pH, Arterial: 7.367 (ref 7.350–7.450)
pO2, Arterial: 73.2 mmHg — ABNORMAL LOW (ref 80.0–100.0)

## 2014-06-21 LAB — BASIC METABOLIC PANEL
ANION GAP: 15 (ref 5–15)
BUN: 28 mg/dL — ABNORMAL HIGH (ref 6–23)
CHLORIDE: 108 meq/L (ref 96–112)
CO2: 21 mEq/L (ref 19–32)
Calcium: 9 mg/dL (ref 8.4–10.5)
Creatinine, Ser: 1.21 mg/dL (ref 0.50–1.35)
GFR calc non Af Amer: 52 mL/min — ABNORMAL LOW (ref >90)
GFR, EST AFRICAN AMERICAN: 61 mL/min — AB (ref >90)
Glucose, Bld: 112 mg/dL — ABNORMAL HIGH (ref 70–99)
POTASSIUM: 4.2 meq/L (ref 3.7–5.3)
Sodium: 144 mEq/L (ref 137–147)

## 2014-06-21 LAB — TYPE AND SCREEN
ABO/RH(D): O POS
Antibody Screen: NEGATIVE

## 2014-06-21 LAB — LACTIC ACID, PLASMA: LACTIC ACID, VENOUS: 1.3 mmol/L (ref 0.5–2.2)

## 2014-06-21 LAB — ABO/RH: ABO/RH(D): O POS

## 2014-06-21 LAB — AMMONIA: Ammonia: 10 umol/L — ABNORMAL LOW (ref 11–60)

## 2014-06-21 MED ORDER — NICOTINE 7 MG/24HR TD PT24
7.0000 mg | MEDICATED_PATCH | TRANSDERMAL | Status: DC
  Administered 2014-06-22 – 2014-06-24 (×3): 7 mg via TRANSDERMAL
  Filled 2014-06-21 (×6): qty 1

## 2014-06-21 MED ORDER — HYDROCODONE-ACETAMINOPHEN 5-325 MG PO TABS: 1.0000 | ORAL_TABLET | Freq: Four times a day (QID) | ORAL | Status: DC | PRN

## 2014-06-21 MED ORDER — SENNA 8.6 MG PO TABS
1.0000 | ORAL_TABLET | Freq: Two times a day (BID) | ORAL | Status: DC
  Administered 2014-06-22 – 2014-06-25 (×7): 8.6 mg via ORAL
  Filled 2014-06-21 (×9): qty 1

## 2014-06-21 MED ORDER — LISINOPRIL 10 MG PO TABS
10.0000 mg | ORAL_TABLET | Freq: Every day | ORAL | Status: DC
  Administered 2014-06-22 – 2014-06-25 (×4): 10 mg via ORAL
  Filled 2014-06-21 (×4): qty 1

## 2014-06-21 MED ORDER — BUPROPION HCL 100 MG PO TABS
100.0000 mg | ORAL_TABLET | Freq: Two times a day (BID) | ORAL | Status: DC
Start: 2014-06-22 — End: 2014-06-21

## 2014-06-21 MED ORDER — LEVOFLOXACIN IN D5W 750 MG/150ML IV SOLN
750.0000 mg | INTRAVENOUS | Status: DC
  Administered 2014-06-22: 750 mg via INTRAVENOUS
  Filled 2014-06-21 (×2): qty 150

## 2014-06-21 MED ORDER — IPRATROPIUM-ALBUTEROL 0.5-2.5 (3) MG/3ML IN SOLN
3.0000 mL | RESPIRATORY_TRACT | Status: DC
  Administered 2014-06-21 – 2014-06-22 (×6): 3 mL via RESPIRATORY_TRACT
  Filled 2014-06-21 (×6): qty 3

## 2014-06-21 MED ORDER — ROSUVASTATIN CALCIUM 10 MG PO TABS
10.0000 mg | ORAL_TABLET | Freq: Every day | ORAL | Status: DC
  Administered 2014-06-22 – 2014-06-25 (×4): 10 mg via ORAL
  Filled 2014-06-21 (×4): qty 1

## 2014-06-21 MED ORDER — METHOCARBAMOL 500 MG PO TABS: 500.0000 mg | ORAL_TABLET | Freq: Four times a day (QID) | ORAL | Status: DC | PRN

## 2014-06-21 MED ORDER — AMLODIPINE BESYLATE 5 MG PO TABS
5.0000 mg | ORAL_TABLET | Freq: Every day | ORAL | Status: DC
  Administered 2014-06-22 – 2014-06-25 (×4): 5 mg via ORAL
  Filled 2014-06-21 (×4): qty 1

## 2014-06-21 MED ORDER — METHOCARBAMOL 1000 MG/10ML IJ SOLN: 500.0000 mg | Freq: Four times a day (QID) | INTRAVENOUS | Status: DC | PRN

## 2014-06-21 MED ORDER — ACETAMINOPHEN 325 MG PO TABS
650.0000 mg | ORAL_TABLET | Freq: Four times a day (QID) | ORAL | Status: DC | PRN
  Administered 2014-06-22 – 2014-06-25 (×6): 650 mg via ORAL
  Filled 2014-06-21 (×6): qty 2

## 2014-06-21 MED ORDER — MORPHINE SULFATE 2 MG/ML IJ SOLN: 0.5000 mg | INTRAMUSCULAR | Status: DC | PRN

## 2014-06-21 MED ORDER — ENOXAPARIN SODIUM 40 MG/0.4ML ~~LOC~~ SOLN
40.0000 mg | SUBCUTANEOUS | Status: DC
  Administered 2014-06-22 – 2014-06-25 (×4): 40 mg via SUBCUTANEOUS
  Filled 2014-06-21 (×4): qty 0.4

## 2014-06-21 MED ORDER — IOHEXOL 350 MG/ML SOLN
100.0000 mL | Freq: Once | INTRAVENOUS | Status: AC | PRN
  Administered 2014-06-21: 100 mL via INTRAVENOUS

## 2014-06-21 NOTE — ED Notes (Signed)
Per [facility]: Pt from [facility] being evaluated for fall on Wednesday with left hip pain. Pt had CT and Xray showing left hip fracture. Pt has rotation noted. No shortening. Pulses intact. Pt in NAD. AO x 4.

## 2014-06-21 NOTE — Progress Notes (Signed)
Patient arrived on the unit around 2130.  Patient was alert and verbal, some confusion.  On call came to see patient around 2200.  At 2230 patient called out.  Nurse found patient on floor out of bed.  Patient had previous skin tear to left elbow.  New skin tear about 1/2 inch to right wrist. Both cleansed mepilex applied. No other injuries observed. On call was still on unit.  Came and assessed patient again and on call contacted patient's wife.  Patient is alert and verbal, still with some confusion.  On call added new orders.  Patient currently in bed with bed alarm on.

## 2014-06-21 NOTE — Consult Note (Signed)
ORTHOPAEDIC CONSULTATION  REQUESTING PHYSICIAN: Ephraim Hamburger, MD  Chief Complaint: Left nondisplaced acetabular fracture  HPI: Henry Cruz is a 80 y.o. male who complains of an unwittnessed fall. He has a H/o CVA and mild dementia  Past Medical History  Diagnosis Date  . Hypertension   . Hyperlipidemia   . Stroke   . Cancer    Past Surgical History  Procedure Laterality Date  . Hernia repair    . Aorta surgery     History   Social History  . Marital Status: Married    Spouse Name: N/A    Number of Children: N/A  . Years of Education: N/A   Social History Main Topics  . Smoking status: Current Every Day Smoker -- 0.50 packs/day for 70 years    Types: Cigarettes  . Smokeless tobacco: None  . Alcohol Use: Yes  . Drug Use: None  . Sexual Activity: None   Other Topics Concern  . None   Social History Narrative   Family History  Problem Relation Age of Onset  . Heart attack Father    Allergies  Allergen Reactions  . Erythromycin    Prior to Admission medications   Medication Sig Start Date End Date Taking? Authorizing Provider  Acetaminophen (PAIN RELIEVER EXTRA STRENGTH PO) Take 2 tablets by mouth daily.    Historical Provider, MD  amLODipine (NORVASC) 5 MG tablet Take 5 mg by mouth daily.    Historical Provider, MD  buPROPion (WELLBUTRIN) 100 MG tablet Take 100 mg by mouth 2 (two) times daily.    Historical Provider, MD  diclofenac sodium (VOLTAREN) 1 % GEL Apply 4 g topically 4 (four) times daily. To affected joint. 09/12/13   Silverio Decamp, MD  HYDROcodone-acetaminophen (NORCO/VICODIN) 5-325 MG per tablet Take 1 tablet by mouth every 4 (four) hours as needed for severe pain. Take with food. Caution: May cause drowsiness 02/28/14   Jacqulyn Cane, MD  ibuprofen (ADVIL,MOTRIN) 200 MG tablet Take three tablets ( 600 milligrams total) every 6 with food as needed for pain. 02/28/14   Jacqulyn Cane, MD  lisinopril (PRINIVIL,ZESTRIL) 10 MG tablet Take  10 mg by mouth daily.    Historical Provider, MD  Multiple Vitamins-Minerals (CENTRUM SILVER PO) Take 1 tablet by mouth daily.    Historical Provider, MD  rosuvastatin (CRESTOR) 10 MG tablet Take 10 mg by mouth daily.    Historical Provider, MD   Dg Hip Complete Left  06/21/2014   ADDENDUM REPORT: 06/21/2014 16:26  ADDENDUM: There is calcification in the right mid abdomen, due to peripherally calcified fluid collection arising adjacent to the right kidney as noted on CT examination May 24, 2014. There is an aortic stent graft present.   Electronically Signed   By: Lowella Grip M.D.   On: 06/21/2014 16:26   06/21/2014   CLINICAL DATA:  Patient fell twice in past 2 days with pain  EXAM: LEFT HIP - COMPLETE 2+ VIEW  COMPARISON:  None.  FINDINGS: Frontal pelvis as well as frontal and lateral left hip images were obtained. There is moderate symmetric narrowing of both hip joints. There is no appreciable fracture or dislocation. No erosive change. There is postoperative change in each inguinal region.  IMPRESSION: Moderate symmetric narrowing of both hip joints. No fracture or dislocation is appreciable on this study.  Electronically Signed: By: Lowella Grip M.D. On: 06/21/2014 16:18   Ct Hip Left Wo Contrast  06/21/2014   CLINICAL DATA:  [name] out  of bed two days previous with left hip pain  EXAM: CT OF THE LEFT HIP WITHOUT CONTRAST  TECHNIQUE: Multidetector CT imaging of the left hip was performed according to the standard protocol. Multiplanar CT image reconstructions were also generated.  COMPARISON:  Plain film from earlier in the same day  FINDINGS: Generalized osteopenia is noted. Small left hip joint effusion is noted. The proximal left femur is well seated and within normal limits without fracture. There is however evidence of a comminuted fracture which involves the acetabulum and extends posteriorly, superiorly and anteriorly. It also extends medially to involve the junction with the  superior pubic ramus on the left. No significant displacement of the fracture fragments is noted. No other focal abnormality is seen.  IMPRESSION: Comminuted fracture in the left acetabulum without significant displacement.  These results will be called to the ordering clinician or representative by the Radiologist Assistant, and communication documented in the PACS or zVision Dashboard.   Electronically Signed   By: Inez Catalina M.D.   On: 06/21/2014 17:09    Positive ROS: All other systems have been reviewed and were otherwise negative with the exception of those mentioned in the HPI and as above.  Labs cbc  Recent Labs  06/21/14 1835  WBC 8.2  HGB 11.4*  HCT 35.5*  PLT 213    Labs inflam No results for input(s): CRP in the last 72 hours.  Invalid input(s): ESR  Labs coag No results for input(s): INR, PTT in the last 72 hours.  Invalid input(s): PT  No results for input(s): NA, K, CL, CO2, GLUCOSE, BUN, CREATININE, CALCIUM in the last 72 hours.  Physical Exam: Filed Vitals:   06/21/14 1805  BP: 135/79  Pulse: 79  Temp: 98.3 F (36.8 C)  Resp: 18   General: Alert, no acute distress Cardiovascular: No pedal edema Respiratory: No cyanosis, no use of accessory musculature GI: No organomegaly, abdomen is soft and non-tender Skin: No lesions in the area of chief complaint other than those listed below in MS[facility] exam.  Neurologic: Sensation intact distally Lymphatic: No axillary or cervical lymphadenopathy  MUSCULOSKELETAL:  LLE: pain with large arc ROM. SILT grossly, Compartments soft, 2+dp Other extremities are atraumatic with painless ROM and NVI.  Assessment: Left nondisplaced acetabular fracture  Plan: Non-op treatment  Weight Bearing Status: TDWB PT VTE px: SCD's and chemical per primarty team   [name], MD Cell [phone]   06/21/2014 6:48 PM

## 2014-06-21 NOTE — ED Provider Notes (Signed)
CSN: [id number]     Arrival date & time 06/21/14  1758 History   First MD Initiated Contact with Patient 06/21/14 1800     Chief Complaint  Patient presents with  . Hip Pain  . Fall     (Consider location/radiation/quality/duration/timing/severity/associated sxs/prior Treatment) HPI  80 year old male presents with a left hip fracture. 2 days ago he vomited after dinner and then stood up and slipped on the emesis. He fell injuring his left leg. The patient has not been able to ambulate since. As he has not been able to get up the family took him to his doctor. They took x-rays were negative and his CT that showed a left acetabulum fracture with minimal displacement. Patient also injured his left elbow with a skin tear but has no pain. Patient states as long as he is not being moved he has no pain and declines pain medicine. Did not hit his head. Has not been acting more confused than normal.  Past Medical History  Diagnosis Date  . Hypertension   . Hyperlipidemia   . Stroke   . Cancer    Past Surgical History  Procedure Laterality Date  . Hernia repair    . Aorta surgery     Family History  Problem Relation Age of Onset  . Heart attack Father    History  Substance Use Topics  . Smoking status: Current Every Day Smoker -- 0.50 packs/day for 70 years    Types: Cigarettes  . Smokeless tobacco: Not on file  . Alcohol Use: Yes    Review of Systems  Cardiovascular: Negative for chest pain.  Musculoskeletal: Positive for arthralgias. Negative for joint swelling.  Neurological: Negative for weakness and numbness.  All other systems reviewed and are negative.     Allergies  Erythromycin  Home Medications   Prior to Admission medications   Medication Sig Start Date End Date Taking? Authorizing Provider  Acetaminophen (PAIN RELIEVER EXTRA STRENGTH PO) Take 2 tablets by mouth daily.    Historical Provider, MD  amLODipine (NORVASC) 5 MG tablet Take 5 mg by mouth daily.     Historical Provider, MD  buPROPion (WELLBUTRIN) 100 MG tablet Take 100 mg by mouth 2 (two) times daily.    Historical Provider, MD  diclofenac sodium (VOLTAREN) 1 % GEL Apply 4 g topically 4 (four) times daily. To affected joint. 09/12/13   Silverio Decamp, MD  HYDROcodone-acetaminophen (NORCO/VICODIN) 5-325 MG per tablet Take 1 tablet by mouth every 4 (four) hours as needed for severe pain. Take with food. Caution: May cause drowsiness 02/28/14   Jacqulyn Cane, MD  ibuprofen (ADVIL,MOTRIN) 200 MG tablet Take three tablets ( 600 milligrams total) every 6 with food as needed for pain. 02/28/14   Jacqulyn Cane, MD  lisinopril (PRINIVIL,ZESTRIL) 10 MG tablet Take 10 mg by mouth daily.    Historical Provider, MD  Multiple Vitamins-Minerals (CENTRUM SILVER PO) Take 1 tablet by mouth daily.    Historical Provider, MD  rosuvastatin (CRESTOR) 10 MG tablet Take 10 mg by mouth daily.    Historical Provider, MD   BP 135/79 mmHg  Pulse 79  Temp(Src) 98.3 F (36.8 C) (Oral)  Resp 18  SpO2 90% Physical Exam  Constitutional: He is oriented to person, place, and time. He appears well-developed and well-nourished.  HENT:  Head: Normocephalic and atraumatic.  Right Ear: External ear normal.  Left Ear: External ear normal.  Nose: Nose normal.  Eyes: Right eye exhibits no discharge. Left eye exhibits no  discharge.  Neck: Neck supple.  Cardiovascular: Normal rate, regular rhythm, normal heart sounds and intact distal pulses.   Pulses:      Radial pulses are 2+ on the right side, and 2+ on the left side.       Dorsalis pedis pulses are 2+ on the right side, and 2+ on the left side.  Pulmonary/Chest: Effort normal.  Abdominal: Soft. He exhibits no distension. There is no tenderness.  Musculoskeletal: He exhibits no edema.       Left elbow: He exhibits laceration (superficial skin tear). He exhibits normal range of motion. No tenderness found.       Left hip: He exhibits decreased range of motion and  tenderness (lateral).  Neurological: He is alert and oriented to person, place, and time.  Skin: Skin is warm and dry.  Nursing note and vitals reviewed.   ED Course  Procedures (including critical care time) Labs Review Labs Reviewed  BASIC METABOLIC PANEL - Abnormal; Notable for the following:    Glucose, Bld 112 (*)    BUN 28 (*)    GFR calc non Af Amer 52 (*)    GFR calc Af Amer 61 (*)    All other components within normal limits  CBC WITH DIFFERENTIAL - Abnormal; Notable for the following:    RBC 3.79 (*)    Hemoglobin 11.4 (*)    HCT 35.5 (*)    Neutrophils Relative % 84 (*)    Lymphocytes Relative 6 (*)    Lymphs Abs 0.5 (*)    All other components within normal limits  PROTIME-INR  TYPE AND SCREEN  ABO/RH    Imaging Review Dg Hip Complete Left  06/21/2014   ADDENDUM REPORT: 06/21/2014 16:26  ADDENDUM: There is calcification in the right mid abdomen, due to peripherally calcified fluid collection arising adjacent to the right kidney as noted on CT examination May 24, 2014. There is an aortic stent graft present.   Electronically Signed   By: Lowella Grip M.D.   On: 06/21/2014 16:26   06/21/2014   CLINICAL DATA:  Patient fell twice in past 2 days with pain  EXAM: LEFT HIP - COMPLETE 2+ VIEW  COMPARISON:  None.  FINDINGS: Frontal pelvis as well as frontal and lateral left hip images were obtained. There is moderate symmetric narrowing of both hip joints. There is no appreciable fracture or dislocation. No erosive change. There is postoperative change in each inguinal region.  IMPRESSION: Moderate symmetric narrowing of both hip joints. No fracture or dislocation is appreciable on this study.  Electronically Signed: By: Lowella Grip M.D. On: 06/21/2014 16:18   Ct Hip Left Wo Contrast  06/21/2014   CLINICAL DATA:  [name] out of bed two days previous with left hip pain  EXAM: CT OF THE LEFT HIP WITHOUT CONTRAST  TECHNIQUE: Multidetector CT imaging of the left hip  was performed according to the standard protocol. Multiplanar CT image reconstructions were also generated.  COMPARISON:  Plain film from earlier in the same day  FINDINGS: Generalized osteopenia is noted. Small left hip joint effusion is noted. The proximal left femur is well seated and within normal limits without fracture. There is however evidence of a comminuted fracture which involves the acetabulum and extends posteriorly, superiorly and anteriorly. It also extends medially to involve the junction with the superior pubic ramus on the left. No significant displacement of the fracture fragments is noted. No other focal abnormality is seen.  IMPRESSION: Comminuted fracture in the  left acetabulum without significant displacement.  These results will be called to the ordering clinician or representative by the Radiologist Assistant, and communication documented in the PACS or zVision Dashboard.   Electronically Signed   By: Inez Catalina M.D.   On: 06/21/2014 17:09     EKG Interpretation None      MDM   Final diagnoses:  Acetabular fracture, left, closed, initial encounter    D/w Dr. Percell Miller who will evaluate patient in AM. Patient will be made NWB and admitted to medicine. Possible OR vs non-op. NV intact. Declines pain meds in ED. Patient initially hypoxic on arrival, CXR clear. Has productive cough but no obvious pneumonia. Due to this will add on CTA to r/o PE   Ephraim Hamburger, MD 06/21/14 2009

## 2014-06-21 NOTE — Assessment & Plan Note (Addendum)
Following Wednesday, unable to bear weight since then. Pain at the femoral acetabular joint and with external and internal rotation. Right hip is okay.  X-rays, hip and pelvis. Disposition will depend on results.  X-ray does show what appears to be a crack in the posterior superior acetabulum on my personal interpretation despite negative review by radiology. Stat CT of the left hip without contrast.  CT scan confirms comminuted fracture of the acetabulum, he will need to be nonweightbearing, realistically this will not be able to happen at home, with his elderly wife, he needs to go to the emergency department and be hospitalized in anticipation of placement.

## 2014-06-21 NOTE — Progress Notes (Signed)
   Subjective:    I'm seeing this patient as a consultation for:  Dr. Malen Gauze  CC: left groin pain  HPI: This is a very pleasant 80 year old male, 2 days ago he fell and was unable to bear weight since. He was brought in today by EMS to my office, on exam in the gurney he had exquisite pain over the anterior groin, and reproduction of pain with internal rotation of the hip. Pain was severe, persistent.  Past medical history, Surgical history, Family history not pertinant except as noted below, Social history, Allergies, and medications have been entered into the medical record, reviewed, and no changes needed.   Review of Systems: No headache, visual changes, nausea, vomiting, diarrhea, constipation, dizziness, abdominal pain, skin rash, fevers, chills, night sweats, weight loss, swollen lymph nodes, body aches, joint swelling, muscle aches, chest pain, shortness of breath, mood changes, visual or auditory hallucinations.   Objective:   General: Well Developed, well nourished, and in no acute distress.  Neuro/Psych: Alert and oriented x3, extra-ocular muscles intact, able to move all 4 extremities, sensation grossly intact. Skin: Warm and dry, no rashes noted.  Respiratory: Not using accessory muscles, speaking in full sentences, trachea midline.  Cardiovascular: Pulses palpable, no extremity edema. Abdomen: Does not appear distended. Left hip: Tender to palpation anteriorly with reproduction of pain on internal rotation.neurovascularly intact distally.  Initial x-rays were read as negative by radiology however I do see a lucency through the superior acetabulum on my personal interpretation.  We obtained a stat CT scan, Which did show a comminuted acetabular fracture extending into the pelvis.   Impression and Recommendations:   This case required medical decision making of moderate complexity.  I spent over 40 minutes with this patient, greater than 50% was face-to-face time  counseling regarding the aboveAnd below diagnoses.

## 2014-06-21 NOTE — Progress Notes (Signed)
ANTIBIOTIC CONSULT NOTE - INITIAL  Pharmacy Consult for Levaquin  Indication: pneumonia  Allergies  Allergen Reactions  . Erythromycin     Patient Measurements:   Adjusted Body Weight: n/a   Vital Signs: Temp: 98.1 F (36.7 C) (11/13 2134) Temp Source: Oral (11/13 1805) BP: 177/63 mmHg (11/13 2134) Pulse Rate: 75 (11/13 2134) Intake/Output from previous day:   Intake/Output from this shift:    Labs:  Recent Labs  06/21/14 1835  WBC 8.2  HGB 11.4*  PLT 213  CREATININE 1.21   CrCl cannot be calculated (Unknown ideal weight.). No results for input(s): VANCOTROUGH, VANCOPEAK, VANCORANDOM, GENTTROUGH, GENTPEAK, GENTRANDOM, TOBRATROUGH, TOBRAPEAK, TOBRARND, AMIKACINPEAK, AMIKACINTROU, AMIKACIN in the last 72 hours.   Microbiology: No results found for this or any previous visit (from the past 720 hour(s)).  Medical History: Past Medical History  Diagnosis Date  . Hypertension   . Hyperlipidemia   . Stroke   . Cancer     Medications:  Prescriptions prior to admission  Medication Sig Dispense Refill Last Dose  . Acetaminophen (PAIN RELIEVER EXTRA STRENGTH PO) Take 2 tablets by mouth daily.   Taking  . amLODipine (NORVASC) 5 MG tablet Take 5 mg by mouth daily.   Taking  . buPROPion (WELLBUTRIN) 100 MG tablet Take 100 mg by mouth 2 (two) times daily.   Taking  . diclofenac sodium (VOLTAREN) 1 % GEL Apply 4 g topically 4 (four) times daily. To affected joint. 400 g 11 Taking  . HYDROcodone-acetaminophen (NORCO/VICODIN) 5-325 MG per tablet Take 1 tablet by mouth every 4 (four) hours as needed for severe pain. Take with food. Caution: May cause drowsiness 15 tablet 1   . ibuprofen (ADVIL,MOTRIN) 200 MG tablet Take three tablets ( 600 milligrams total) every 6 with food as needed for pain. 30 tablet 0   . lisinopril (PRINIVIL,ZESTRIL) 10 MG tablet Take 10 mg by mouth daily.   Taking  . Multiple Vitamins-Minerals (CENTRUM SILVER PO) Take 1 tablet by mouth daily.   Taking   . rosuvastatin (CRESTOR) 10 MG tablet Take 10 mg by mouth daily.   Taking   Assessment: 37 YOM who presented with left hip fracture and a productive cough. Pharmacy consulted to start Levaquin as empiric therapy for CAP. WBC is wnl and pt is afebrile. CrCl ~ 40 mL/min.   Goal of Therapy:  Resolution of infection   Plan:  -Levaquin 750 mg IV Q 48 hours -Monitor CBC, renal fx, and patient's clinical progress  Albertina Parr, PharmD.  Clinical Pharmacist Pager [phone]

## 2014-06-21 NOTE — H&P (Signed)
[facility] History and Physical  Patient: Henry Cruz  IPJ:[id number]  DOB: 1924/11/16  DOS: the patient was seen and examined on 06/21/2014 PCP: Houston Siren, MD  Chief Complaint: left hip pain  HPI: Henry Cruz is a 80 y.o. male with Past medical history of hypertension, dyslipidemia, CVA. The patient is presenting with complaints of left hip pain that started 2 days ago after his recent fall. The fall was not witnessed but patient was alone and sitting and was eating and had an episode of vomiting and he slipped on the vomitus and fell on the floor. He did not hit his head or neck. But he started having complaints of pain on his left hip and was unable to move around and therefore he saw his sport physician who referred him to ER for further workup. Patient has been having on and off episodes of confusion as per patient's wife. Also on the day of the fall the patient has been having episodes of vomiting as well as multiple episodes of diarrhea. No recent change in his medications reported. Patient has been coughing at his baseline and has rattling in his chest at his baseline. Patient is an active smoker. Patient was admitted at [facility] after a fall leading to skull fracture and has been having difficulty with balance since then and is working with physical therapy.  The patient is coming from home And at his baseline independent for most of his ADL.  Review of Systems: as mentioned in the history of present illness.  A Comprehensive review of the other systems is negative.  Past Medical History  Diagnosis Date  . Hypertension   . Hyperlipidemia   . Stroke   . Cancer    Past Surgical History  Procedure Laterality Date  . Hernia repair    . Aorta surgery     Social History:  reports that he has been smoking Cigarettes.  He has a 35 pack-year smoking history. He does not have any smokeless tobacco history on file. He reports that he drinks alcohol. His  drug history is not on file.  Allergies  Allergen Reactions  . Erythromycin     Family History  Problem Relation Age of Onset  . Heart attack Father     Prior to Admission medications   Medication Sig Start Date End Date Taking? Authorizing Provider  Acetaminophen (PAIN RELIEVER EXTRA STRENGTH PO) Take 2 tablets by mouth daily.    Historical Provider, MD  amLODipine (NORVASC) 5 MG tablet Take 5 mg by mouth daily.    Historical Provider, MD  buPROPion (WELLBUTRIN) 100 MG tablet Take 100 mg by mouth 2 (two) times daily.    Historical Provider, MD  diclofenac sodium (VOLTAREN) 1 % GEL Apply 4 g topically 4 (four) times daily. To affected joint. 09/12/13   Silverio Decamp, MD  HYDROcodone-acetaminophen (NORCO/VICODIN) 5-325 MG per tablet Take 1 tablet by mouth every 4 (four) hours as needed for severe pain. Take with food. Caution: May cause drowsiness 02/28/14   Jacqulyn Cane, MD  ibuprofen (ADVIL,MOTRIN) 200 MG tablet Take three tablets ( 600 milligrams total) every 6 with food as needed for pain. 02/28/14   Jacqulyn Cane, MD  lisinopril (PRINIVIL,ZESTRIL) 10 MG tablet Take 10 mg by mouth daily.    Historical Provider, MD  Multiple Vitamins-Minerals (CENTRUM SILVER PO) Take 1 tablet by mouth daily.    Historical Provider, MD  rosuvastatin (CRESTOR) 10 MG tablet Take 10 mg by mouth daily.  Historical Provider, MD    Physical Exam: Filed Vitals:   06/21/14 1931 06/21/14 2134 06/21/14 2232 06/21/14 2304  BP: 149/64 177/63 147/115   Pulse: 76 75 76   Temp:  98.1 F (36.7 C) 97.3 F (36.3 C)   TempSrc:      Resp: 18 18 18    Weight:  65.3 kg (143 lb 15.4 oz)    SpO2: 94% 93% 95% 94%    General: Alert, Awake and Oriented to Time, Place and Person. Appear in moderate distress Eyes: PERRL ENT: Oral Mucosa clear moist. Neck: no JVD Cardiovascular: S1 and S2 Present, aortic systolic Murmur, Peripheral Pulses Present Respiratory: Bilateral Air entry equal and Decreased, bilateral  rhonchi and Crackles, occasional wheezes Abdomen: Bowel Sound present, Soft and non- tender Skin: no Rash Extremities: trace Pedal edema, no calf tenderness Neurologic: Grossly no focal neuro deficitwith occasional confusion  Labs on Admission:  CBC:  Recent Labs Lab 06/21/14 1835  WBC 8.2  NEUTROABS 7.0  HGB 11.4*  HCT 35.5*  MCV 93.7  PLT 213    CMP     Component Value Date/Time   NA 144 06/21/2014 1835   K 4.2 06/21/2014 1835   CL 108 06/21/2014 1835   CO2 21 06/21/2014 1835   GLUCOSE 112* 06/21/2014 1835   BUN 28* 06/21/2014 1835   CREATININE 1.21 06/21/2014 1835   CALCIUM 9.0 06/21/2014 1835   GFRNONAA 52* 06/21/2014 1835   GFRAA 61* 06/21/2014 1835    No results for input(s): LIPASE, AMYLASE in the last 168 hours.  Recent Labs Lab 06/21/14 2319  AMMONIA 10*    No results for input(s): CKTOTAL, CKMB, CKMBINDEX, TROPONINI in the last 168 hours. BNP (last 3 results) No results for input(s): PROBNP in the last 8760 hours.  Radiological Exams on Admission: Dg Hip Complete Left  06/21/2014   ADDENDUM REPORT: 06/21/2014 16:26  ADDENDUM: There is calcification in the right mid abdomen, due to peripherally calcified fluid collection arising adjacent to the right kidney as noted on CT examination May 24, 2014. There is an aortic stent graft present.   Electronically Signed   By: Lowella Grip M.D.   On: 06/21/2014 16:26   06/21/2014   CLINICAL DATA:  Patient fell twice in past 2 days with pain  EXAM: LEFT HIP - COMPLETE 2+ VIEW  COMPARISON:  None.  FINDINGS: Frontal pelvis as well as frontal and lateral left hip images were obtained. There is moderate symmetric narrowing of both hip joints. There is no appreciable fracture or dislocation. No erosive change. There is postoperative change in each inguinal region.  IMPRESSION: Moderate symmetric narrowing of both hip joints. No fracture or dislocation is appreciable on this study.  Electronically Signed: By:  Lowella Grip M.D. On: 06/21/2014 16:18   Ct Angio Chest Pe W/cm &/or Wo Cm  06/21/2014   CLINICAL DATA:  Fall with acetabular fracture 2 days ago. Shortness of breath. Hypertension.  EXAM: CT ANGIOGRAPHY CHEST WITH CONTRAST  TECHNIQUE: Multidetector CT imaging of the chest was performed using the standard protocol during bolus administration of intravenous contrast. Multiplanar CT image reconstructions and MIPs were obtained to evaluate the vascular anatomy.  CONTRAST:  183mL OMNIPAQUE IOHEXOL 350 MG/ML SOLN  COMPARISON:  Radiograph of earlier today.  No prior CT.  FINDINGS: Lungs/Pleura: Mild degradation secondary to patient arm position, not raised above the head. There is also mild motion degradation throughout. This is most severe at the lung bases. Advanced centrilobular emphysema.  Right base  atelectasis. No correlate for the right upper lobe opacity questioned on plain film within the lungs.  Trace left pleural fluid.  Heart/Mediastinum: The quality of this examination for evaluation of pulmonary embolism is good. Limitations are detailed above. The bolus is well timed. No evidence of pulmonary embolism to the large segmental level.  Pulmonary artery enlargement, with the outflow tract measuring 3.9 cm.  Aortic and branch vessel atherosclerosis. Tortuous thoracic aorta. Mild ectasia of the ascending segment 4.1 cm. Tortuous descending segment. Moderate cardiomegaly with coronary artery atherosclerosis. No mediastinal or hilar adenopathy. Small hiatal hernia.  Upper Abdomen: Multiple hepatic cysts. Normal spleen, distal stomach, pancreas, gallbladder, biliary tract, adrenal glands. Bilateral renal lesions which were better characterized on the recent dedicated CT. No free intraperitoneal air or imaged fluid. Incompletely imaged abdominal aortic endograft repair.  Bones/Musculoskeletal: Posterior high right rib fractures are remote. There also anterior high right rib fractures which are remote.  Review  of the MIP images confirms the above findings.  IMPRESSION: 1. Degradation secondary to patient arm position and respiratory motion. 2. No evidence of pulmonary embolism to the large segmental level. 3. Centrilobular emphysema. 4. Pulmonary artery enlargement suggests pulmonary arterial hypertension. 5. Incompletely imaged aortic endograft repair. 6. Mild ectasia of the ascending aorta.   Electronically Signed   By: Abigail Miyamoto M.D.   On: 06/21/2014 21:03   Ct Hip Left Wo Contrast  06/21/2014   CLINICAL DATA:  [name] out of bed two days previous with left hip pain  EXAM: CT OF THE LEFT HIP WITHOUT CONTRAST  TECHNIQUE: Multidetector CT imaging of the left hip was performed according to the standard protocol. Multiplanar CT image reconstructions were also generated.  COMPARISON:  Plain film from earlier in the same day  FINDINGS: Generalized osteopenia is noted. Small left hip joint effusion is noted. The proximal left femur is well seated and within normal limits without fracture. There is however evidence of a comminuted fracture which involves the acetabulum and extends posteriorly, superiorly and anteriorly. It also extends medially to involve the junction with the superior pubic ramus on the left. No significant displacement of the fracture fragments is noted. No other focal abnormality is seen.  IMPRESSION: Comminuted fracture in the left acetabulum without significant displacement.  These results will be called to the ordering clinician or representative by the Radiologist Assistant, and communication documented in the PACS or zVision Dashboard.   Electronically Signed   By: Inez Catalina M.D.   On: 06/21/2014 17:09   Dg Chest Port 1 View  06/21/2014   CLINICAL DATA:  [name] June 19, 2014, productive cough for 1 week, image evaluation 03/07/2014  EXAM: PORTABLE CHEST - 1 VIEW  COMPARISON:  None.  FINDINGS: The heart size and vascular pattern are normal. Aortic calcifications stable. Left lung is clear  except for mono lower lobe scarring or atelectasis. On the right, there is also mild scarring or atelectasis in the lower lobe which is similar to the prior study  There is an approximately 2 cm rounded opacity projecting over the right upper lobe. It is possible that this is related to the anterior and of the second rib but a pulmonary nodule is not excluded.  There is a mildly to moderately displaced fracture of the posterior left sixth rib. This is not seen on the prior study.  IMPRESSION: Recommend CT thorax to evaluate for possible pulmonary nodule.  Left sixth rib fracture of uncertain age, but apparently new from March 07, 2014.  Electronically Signed   By: Skipper Cliche M.D.   On: 06/21/2014 19:10    Assessment/Plan Principal Problem:   Fracture of left acetabulum Active Problems:   Smoker   COPD (chronic obstructive pulmonary disease)   Acute encephalopathy   Recurrent falls   Essential hypertension   Dyslipidemia   Acute respiratory failure with hypoxia   Acute bronchitis   1. Fracture of left acetabulum The patient is presenting with complaints of a mechanical fall leading to pain in his left groin. CT scan is showing acetabular fracture. Orthopedic was consulted who recommends nonsurgical treatment at present. Currently we will follow orthopedic recommendations. Gentle pain management due to his confusion.  2.acute respiratory failure with hypoxia. Likely secondary to COPD a serration and acute bronchitis. At present I would continue treating him with Levaquin, obtain urine antigens as influenza PCR. Sputum culture as well as flutter device DuoNeb's.  3.acute endocrinopathy. Current workup is not showing any significant abnormality. Likely secondary to pain and hypoxia. Continue close monitoring for delirium.  4.hypertension. Continue home medications at present.  5. Recurrent fall. Continue working with physical therapy.  Advance goals of care discussion: DNR/DNI  as per my discussion with patient and patient's wife   Consults: orthopedics  DVT Prophylaxis: subcutaneous Heparin Nutrition: nothing by mouth except medication  Family Communication: discussed with patient's wife on the phone, opportunity was given to ask question and all questions were answered satisfactorily at the time of interview. Disposition: Admitted to inpatient in telemetry unit.  Author: Berle Mull, MD [facility] Pager: [phone] 06/21/2014, 11:59 PM    If 7PM-7AM, please contact night-coverage [email or web address] Password [email or web address]

## 2014-06-21 NOTE — Consult Note (Signed)
I have reviewed the case with ED Dr. Regenia Skeeter and reviewed the CT  I will perform formal consult in the AM  Plan as of now: -Non-operative treatement -Touchdown weightbearing for 6wks -moblilize with PT -likely to need SNF vs AIR.   [name], D Cell: [phone]

## 2014-06-22 ENCOUNTER — Telehealth: Payer: Self-pay | Admitting: Emergency Medicine

## 2014-06-22 DIAGNOSIS — F17200 Nicotine dependence, unspecified, uncomplicated: Secondary | ICD-10-CM | POA: Diagnosis present

## 2014-06-22 DIAGNOSIS — J9601 Acute respiratory failure with hypoxia: Secondary | ICD-10-CM | POA: Diagnosis present

## 2014-06-22 DIAGNOSIS — J449 Chronic obstructive pulmonary disease, unspecified: Secondary | ICD-10-CM | POA: Diagnosis present

## 2014-06-22 DIAGNOSIS — G9340 Encephalopathy, unspecified: Secondary | ICD-10-CM | POA: Diagnosis present

## 2014-06-22 DIAGNOSIS — E785 Hyperlipidemia, unspecified: Secondary | ICD-10-CM

## 2014-06-22 DIAGNOSIS — J438 Other emphysema: Secondary | ICD-10-CM

## 2014-06-22 DIAGNOSIS — R296 Repeated falls: Secondary | ICD-10-CM

## 2014-06-22 DIAGNOSIS — J209 Acute bronchitis, unspecified: Secondary | ICD-10-CM | POA: Diagnosis present

## 2014-06-22 DIAGNOSIS — I10 Essential (primary) hypertension: Secondary | ICD-10-CM | POA: Diagnosis present

## 2014-06-22 DIAGNOSIS — I359 Nonrheumatic aortic valve disorder, unspecified: Secondary | ICD-10-CM

## 2014-06-22 LAB — PROTIME-INR
INR: 1.12 (ref 0.00–1.49)
Prothrombin Time: 14.5 seconds (ref 11.6–15.2)

## 2014-06-22 LAB — URINALYSIS, ROUTINE W REFLEX MICROSCOPIC
Bilirubin Urine: NEGATIVE
GLUCOSE, UA: NEGATIVE mg/dL
Hgb urine dipstick: NEGATIVE
Ketones, ur: 15 mg/dL — AB
Leukocytes, UA: NEGATIVE
Nitrite: NEGATIVE
PH: 5 (ref 5.0–8.0)
PROTEIN: NEGATIVE mg/dL
Specific Gravity, Urine: >1.046 — ABNORMAL HIGH (ref 1.005–1.030)
Urobilinogen, UA: 0.2 mg/dL (ref 0.0–1.0)

## 2014-06-22 LAB — COMPREHENSIVE METABOLIC PANEL
ALT: 10 U/L (ref 0–53)
AST: 14 U/L (ref 0–37)
Albumin: 3 g/dL — ABNORMAL LOW (ref 3.5–5.2)
Alkaline Phosphatase: 67 U/L (ref 39–117)
Anion gap: 18 — ABNORMAL HIGH (ref 5–15)
BUN: 26 mg/dL — ABNORMAL HIGH (ref 6–23)
CALCIUM: 8.8 mg/dL (ref 8.4–10.5)
CO2: 16 meq/L — AB (ref 19–32)
CREATININE: 1.08 mg/dL (ref 0.50–1.35)
Chloride: 108 mEq/L (ref 96–112)
GFR, EST AFRICAN AMERICAN: 70 mL/min — AB (ref >90)
GFR, EST NON AFRICAN AMERICAN: 60 mL/min — AB (ref >90)
GLUCOSE: 110 mg/dL — AB (ref 70–99)
Potassium: 4.2 mEq/L (ref 3.7–5.3)
Sodium: 142 mEq/L (ref 137–147)
Total Bilirubin: 0.4 mg/dL (ref 0.3–1.2)
Total Protein: 6.2 g/dL (ref 6.0–8.3)

## 2014-06-22 LAB — CBC WITH DIFFERENTIAL/PLATELET
BASOS ABS: 0 10*3/uL (ref 0.0–0.1)
Basophils Relative: 0 % (ref 0–1)
EOS PCT: 1 % (ref 0–5)
Eosinophils Absolute: 0.1 10*3/uL (ref 0.0–0.7)
HCT: 33.4 % — ABNORMAL LOW (ref 39.0–52.0)
Hemoglobin: 11 g/dL — ABNORMAL LOW (ref 13.0–17.0)
LYMPHS ABS: 0.3 10*3/uL — AB (ref 0.7–4.0)
Lymphocytes Relative: 3 % — ABNORMAL LOW (ref 12–46)
MCH: 31.5 pg (ref 26.0–34.0)
MCHC: 32.9 g/dL (ref 30.0–36.0)
MCV: 95.7 fL (ref 78.0–100.0)
MONO ABS: 0.8 10*3/uL (ref 0.1–1.0)
Monocytes Relative: 9 % (ref 3–12)
Neutro Abs: 7.3 10*3/uL (ref 1.7–7.7)
Neutrophils Relative %: 87 % — ABNORMAL HIGH (ref 43–77)
Platelets: 200 10*3/uL (ref 150–400)
RBC: 3.49 MIL/uL — ABNORMAL LOW (ref 4.22–5.81)
RDW: 14.3 % (ref 11.5–15.5)
WBC: 8.5 10*3/uL (ref 4.0–10.5)

## 2014-06-22 LAB — TSH: TSH: 3.53 u[IU]/mL (ref 0.350–4.500)

## 2014-06-22 LAB — STREP PNEUMONIAE URINARY ANTIGEN: Strep Pneumo Urinary Antigen: NEGATIVE

## 2014-06-22 LAB — INFLUENZA PANEL BY PCR (TYPE A & B)
H1N1FLUPCR: NOT DETECTED
INFLAPCR: NEGATIVE
Influenza B By PCR: NEGATIVE

## 2014-06-22 MED ORDER — ALBUTEROL SULFATE (2.5 MG/3ML) 0.083% IN NEBU: 2.5000 mg | INHALATION_SOLUTION | RESPIRATORY_TRACT | Status: DC | PRN

## 2014-06-22 MED ORDER — LORAZEPAM 2 MG/ML IJ SOLN
1.0000 mg | Freq: Once | INTRAMUSCULAR | Status: AC
  Administered 2014-06-22: 1 mg via INTRAVENOUS
  Filled 2014-06-22: qty 1

## 2014-06-22 MED ORDER — IPRATROPIUM-ALBUTEROL 0.5-2.5 (3) MG/3ML IN SOLN
3.0000 mL | Freq: Two times a day (BID) | RESPIRATORY_TRACT | Status: DC
  Administered 2014-06-23: 3 mL via RESPIRATORY_TRACT
  Filled 2014-06-22: qty 3

## 2014-06-22 NOTE — Progress Notes (Signed)
Echo Lab  2D Echocardiogram completed.  [place], RDCS 06/22/2014 9:32 AM

## 2014-06-22 NOTE — Progress Notes (Signed)
[facility] PROGRESS NOTE  Henry Cruz DSK:[id number] DOB: December 01, 1924 DOA: 06/21/2014 PCP: Houston Siren, MD  Assessment/Plan: 1. Left hip fracture. -Patient having an unwitnessed fall at home, with CT scan of left hip showing comminuted fracture in the left acetabulum without significant displacement. -orthopedic surgery consulted, recommending non-operative treatment for now -physical therapy consult placed  2.  Status post fall -Patient having unwitnessed fall at home, has history recurrent falls -His wife denies any history of dementia or cognitive impairment, although I suspect there may be some mild cognitive impairment -physical therapy consultation placed  3.    Hypertension -Patient having elevated blood pressures over the course of the day I suspect pain may be driving his blood pressures up -Continue lisinopril 10 mg by mouth daily and amlodipine 5 mg by mouth daily  4.  Suspected bronchitis versus committed acquire pneumonia -Patient having cough with associated sputum production -He was started on Levaquin 750 mg IV every 48 hours  5.  Dyslipidemia -Continue Crestor  Code Status: DO NOT RESUSCITATE Family Communication: I spoke to his wife over telephone conversation, updated on patient's condition  Disposition Plan: continue supportive care, I suspect will need SNF placement   Consultants:  Orthopedic surgery  Antibiotics:  Levaquin 750 mg IV (started on 06/21/2014)  HPI/Subjective: Patient is a pleasant 80 year old gentleman with a past medical history of dyslipidemia, hypertension, CVA admitted to the medicine service on 06/21/2014 presenting with left hip pain. He had an unwitnessed fall at home. Plain films revealed moderate symmetric narrowing of both hip joints no fracture or dislocation appreciated per radiology. This was followed up with a CT scan a left hip which showed comminuted fracture in the left acetabulum without significant  displacement. Orthopedic surgery consulted.   Objective: Filed Vitals:   06/22/14 1200  BP:   Pulse:   Temp:   Resp: 16    Intake/Output Summary (Last 24 hours) at 06/22/14 1529 Last data filed at 06/22/14 1324  Gross per 24 hour  Intake    360 ml  Output    275 ml  Net     85 ml   Filed Weights   06/21/14 2134  Weight: 65.3 kg (143 lb 15.4 oz)    Exam:   General:  Patient is awake alert, confused, disoriented  Cardiovascular: regular rate and rhythm normal S1-S2  Respiratory: diminished breath sounds bilaterally, positive crackles, scattered wheezes  Abdomen: soft nontender nondistended  Musculoskeletal: patient having pain withpassive and active movement of left hip  Data Reviewed: Basic Metabolic Panel:  Recent Labs Lab 06/21/14 1835 06/22/14 0639  NA 144 142  K 4.2 4.2  CL 108 108  CO2 21 16*  GLUCOSE 112* 110*  BUN 28* 26*  CREATININE 1.21 1.08  CALCIUM 9.0 8.8   Liver Function Tests:  Recent Labs Lab 06/22/14 0639  AST 14  ALT 10  ALKPHOS 67  BILITOT 0.4  PROT 6.2  ALBUMIN 3.0*   No results for input(s): LIPASE, AMYLASE in the last 168 hours.  Recent Labs Lab 06/21/14 2319  AMMONIA 10*   CBC:  Recent Labs Lab 06/21/14 1835 06/22/14 0639  WBC 8.2 8.5  NEUTROABS 7.0 7.3  HGB 11.4* 11.0*  HCT 35.5* 33.4*  MCV 93.7 95.7  PLT 213 200   Cardiac Enzymes: No results for input(s): CKTOTAL, CKMB, CKMBINDEX, TROPONINI in the last 168 hours. BNP (last 3 results) No results for input(s): PROBNP in the last 8760 hours. CBG: No results for input(s): GLUCAP in the last  168 hours.  No results found for this or any previous visit (from the past 240 hour(s)).   Studies: Dg Hip Complete Left  06/21/2014   ADDENDUM REPORT: 06/21/2014 16:26  ADDENDUM: There is calcification in the right mid abdomen, due to peripherally calcified fluid collection arising adjacent to the right kidney as noted on CT examination May 24, 2014. There is an  aortic stent graft present.   Electronically Signed   By: Lowella Grip M.D.   On: 06/21/2014 16:26   06/21/2014   CLINICAL DATA:  Patient fell twice in past 2 days with pain  EXAM: LEFT HIP - COMPLETE 2+ VIEW  COMPARISON:  None.  FINDINGS: Frontal pelvis as well as frontal and lateral left hip images were obtained. There is moderate symmetric narrowing of both hip joints. There is no appreciable fracture or dislocation. No erosive change. There is postoperative change in each inguinal region.  IMPRESSION: Moderate symmetric narrowing of both hip joints. No fracture or dislocation is appreciable on this study.  Electronically Signed: By: Lowella Grip M.D. On: 06/21/2014 16:18   Ct Head Wo Contrast  06/22/2014   CLINICAL DATA:  Patient fell in hospital room, initial evaluation,  EXAM: CT HEAD WITHOUT CONTRAST  TECHNIQUE: Contiguous axial images were obtained from the base of the skull through the vertex without intravenous contrast.  COMPARISON:  August 13, 2011  FINDINGS: No hemorrhage or extra-axial fluid. Diffuse atrophy and low attenuation in the deep white matter progressive when compared the prior study. Multiple chronic small lacunar infarcts in the deep white matter bilaterally. No skull fracture.  IMPRESSION: No acute traumatic injury.  Severe chronic involutional change.   Electronically Signed   By: Skipper Cliche M.D.   On: 06/22/2014 00:02   Ct Angio Chest Pe W/cm &/or Wo Cm  06/21/2014   CLINICAL DATA:  Fall with acetabular fracture 2 days ago. Shortness of breath. Hypertension.  EXAM: CT ANGIOGRAPHY CHEST WITH CONTRAST  TECHNIQUE: Multidetector CT imaging of the chest was performed using the standard protocol during bolus administration of intravenous contrast. Multiplanar CT image reconstructions and MIPs were obtained to evaluate the vascular anatomy.  CONTRAST:  133mL OMNIPAQUE IOHEXOL 350 MG/ML SOLN  COMPARISON:  Radiograph of earlier today.  No prior CT.  FINDINGS:  Lungs/Pleura: Mild degradation secondary to patient arm position, not raised above the head. There is also mild motion degradation throughout. This is most severe at the lung bases. Advanced centrilobular emphysema.  Right base atelectasis. No correlate for the right upper lobe opacity questioned on plain film within the lungs.  Trace left pleural fluid.  Heart/Mediastinum: The quality of this examination for evaluation of pulmonary embolism is good. Limitations are detailed above. The bolus is well timed. No evidence of pulmonary embolism to the large segmental level.  Pulmonary artery enlargement, with the outflow tract measuring 3.9 cm.  Aortic and branch vessel atherosclerosis. Tortuous thoracic aorta. Mild ectasia of the ascending segment 4.1 cm. Tortuous descending segment. Moderate cardiomegaly with coronary artery atherosclerosis. No mediastinal or hilar adenopathy. Small hiatal hernia.  Upper Abdomen: Multiple hepatic cysts. Normal spleen, distal stomach, pancreas, gallbladder, biliary tract, adrenal glands. Bilateral renal lesions which were better characterized on the recent dedicated CT. No free intraperitoneal air or imaged fluid. Incompletely imaged abdominal aortic endograft repair.  Bones/Musculoskeletal: Posterior high right rib fractures are remote. There also anterior high right rib fractures which are remote.  Review of the MIP images confirms the above findings.  IMPRESSION: 1. Degradation secondary  to patient arm position and respiratory motion. 2. No evidence of pulmonary embolism to the large segmental level. 3. Centrilobular emphysema. 4. Pulmonary artery enlargement suggests pulmonary arterial hypertension. 5. Incompletely imaged aortic endograft repair. 6. Mild ectasia of the ascending aorta.   Electronically Signed   By: Abigail Miyamoto M.D.   On: 06/21/2014 21:03   Ct Hip Left Wo Contrast  06/21/2014   CLINICAL DATA:  [name] out of bed two days previous with left hip pain  EXAM: CT OF THE  LEFT HIP WITHOUT CONTRAST  TECHNIQUE: Multidetector CT imaging of the left hip was performed according to the standard protocol. Multiplanar CT image reconstructions were also generated.  COMPARISON:  Plain film from earlier in the same day  FINDINGS: Generalized osteopenia is noted. Small left hip joint effusion is noted. The proximal left femur is well seated and within normal limits without fracture. There is however evidence of a comminuted fracture which involves the acetabulum and extends posteriorly, superiorly and anteriorly. It also extends medially to involve the junction with the superior pubic ramus on the left. No significant displacement of the fracture fragments is noted. No other focal abnormality is seen.  IMPRESSION: Comminuted fracture in the left acetabulum without significant displacement.  These results will be called to the ordering clinician or representative by the Radiologist Assistant, and communication documented in the PACS or zVision Dashboard.   Electronically Signed   By: Inez Catalina M.D.   On: 06/21/2014 17:09   Dg Chest Port 1 View  06/21/2014   CLINICAL DATA:  [name] June 19, 2014, productive cough for 1 week, image evaluation 03/07/2014  EXAM: PORTABLE CHEST - 1 VIEW  COMPARISON:  None.  FINDINGS: The heart size and vascular pattern are normal. Aortic calcifications stable. Left lung is clear except for mono lower lobe scarring or atelectasis. On the right, there is also mild scarring or atelectasis in the lower lobe which is similar to the prior study  There is an approximately 2 cm rounded opacity projecting over the right upper lobe. It is possible that this is related to the anterior and of the second rib but a pulmonary nodule is not excluded.  There is a mildly to moderately displaced fracture of the posterior left sixth rib. This is not seen on the prior study.  IMPRESSION: Recommend CT thorax to evaluate for possible pulmonary nodule.  Left sixth rib fracture of  uncertain age, but apparently new from March 07, 2014.   Electronically Signed   By: Skipper Cliche M.D.   On: 06/21/2014 19:10    Scheduled Meds: . amLODipine  5 mg Oral Daily  . enoxaparin (LOVENOX) injection  40 mg Subcutaneous Q24H  . ipratropium-albuterol  3 mL Nebulization Q4H  . levofloxacin (LEVAQUIN) IV  750 mg Intravenous Q48H  . lisinopril  10 mg Oral Daily  . nicotine  7 mg Transdermal Q24H  . rosuvastatin  10 mg Oral Daily  . senna  1 tablet Oral BID   Continuous Infusions:   Principal Problem:   Fracture of left acetabulum Active Problems:   Smoker   COPD (chronic obstructive pulmonary disease)   Acute encephalopathy   Recurrent falls   Essential hypertension   Dyslipidemia   Acute respiratory failure with hypoxia   Acute bronchitis    Time spent: [place], [place] [facility] Pager [phone]. If 7PM-7AM, please contact night-coverage at [email or web address], password [email or web address] 06/22/2014, 3:29 PM  LOS: 1 day

## 2014-06-22 NOTE — Plan of Care (Signed)
Problem: Phase I Progression Outcomes Goal: Voiding-avoid urinary catheter unless indicated Outcome: Progressing     

## 2014-06-22 NOTE — Consult Note (Signed)
ORTHOPAEDIC CONSULTATION  REQUESTING PHYSICIAN: Ephraim Hamburger, MD  Chief Complaint: Fall left hip pain  HPI: Henry Cruz is a 80 y.o. male who complains of an unwittnessed fall. He is at baseline confusion.   Past Medical History  Diagnosis Date  . Hypertension   . Hyperlipidemia   . Stroke   . Cancer    Past Surgical History  Procedure Laterality Date  . Hernia repair    . Aorta surgery     History   Social History  . Marital Status: Married    Spouse Name: N/A    Number of Children: N/A  . Years of Education: N/A   Social History Main Topics  . Smoking status: Current Every Day Smoker -- 0.50 packs/day for 70 years    Types: Cigarettes  . Smokeless tobacco: None  . Alcohol Use: Yes  . Drug Use: None  . Sexual Activity: None   Other Topics Concern  . None   Social History Narrative   Family History  Problem Relation Age of Onset  . Heart attack Father    Allergies  Allergen Reactions  . Erythromycin    Prior to Admission medications   Medication Sig Start Date End Date Taking? Authorizing Provider  Acetaminophen (PAIN RELIEVER EXTRA STRENGTH PO) Take 2 tablets by mouth daily.    Historical Provider, MD  amLODipine (NORVASC) 5 MG tablet Take 5 mg by mouth daily.    Historical Provider, MD  buPROPion (WELLBUTRIN) 100 MG tablet Take 100 mg by mouth 2 (two) times daily.    Historical Provider, MD  diclofenac sodium (VOLTAREN) 1 % GEL Apply 4 g topically 4 (four) times daily. To affected joint. 09/12/13   Silverio Decamp, MD  HYDROcodone-acetaminophen (NORCO/VICODIN) 5-325 MG per tablet Take 1 tablet by mouth every 4 (four) hours as needed for severe pain. Take with food. Caution: May cause drowsiness 02/28/14   Jacqulyn Cane, MD  ibuprofen (ADVIL,MOTRIN) 200 MG tablet Take three tablets ( 600 milligrams total) every 6 with food as needed for pain. 02/28/14   Jacqulyn Cane, MD  lisinopril (PRINIVIL,ZESTRIL) 10 MG tablet Take 10 mg by mouth daily.     Historical Provider, MD  Multiple Vitamins-Minerals (CENTRUM SILVER PO) Take 1 tablet by mouth daily.    Historical Provider, MD  rosuvastatin (CRESTOR) 10 MG tablet Take 10 mg by mouth daily.    Historical Provider, MD   Dg Hip Complete Left  06/21/2014   ADDENDUM REPORT: 06/21/2014 16:26  ADDENDUM: There is calcification in the right mid abdomen, due to peripherally calcified fluid collection arising adjacent to the right kidney as noted on CT examination May 24, 2014. There is an aortic stent graft present.   Electronically Signed   By: Lowella Grip M.D.   On: 06/21/2014 16:26   06/21/2014   CLINICAL DATA:  Patient fell twice in past 2 days with pain  EXAM: LEFT HIP - COMPLETE 2+ VIEW  COMPARISON:  None.  FINDINGS: Frontal pelvis as well as frontal and lateral left hip images were obtained. There is moderate symmetric narrowing of both hip joints. There is no appreciable fracture or dislocation. No erosive change. There is postoperative change in each inguinal region.  IMPRESSION: Moderate symmetric narrowing of both hip joints. No fracture or dislocation is appreciable on this study.  Electronically Signed: By: Lowella Grip M.D. On: 06/21/2014 16:18   Ct Hip Left Wo Contrast  06/21/2014   CLINICAL DATA:  [name] out of bed  two days previous with left hip pain  EXAM: CT OF THE LEFT HIP WITHOUT CONTRAST  TECHNIQUE: Multidetector CT imaging of the left hip was performed according to the standard protocol. Multiplanar CT image reconstructions were also generated.  COMPARISON:  Plain film from earlier in the same day  FINDINGS: Generalized osteopenia is noted. Small left hip joint effusion is noted. The proximal left femur is well seated and within normal limits without fracture. There is however evidence of a comminuted fracture which involves the acetabulum and extends posteriorly, superiorly and anteriorly. It also extends medially to involve the junction with the superior pubic ramus on the  left. No significant displacement of the fracture fragments is noted. No other focal abnormality is seen.  IMPRESSION: Comminuted fracture in the left acetabulum without significant displacement.  These results will be called to the ordering clinician or representative by the Radiologist Assistant, and communication documented in the PACS or zVision Dashboard.   Electronically Signed   By: Inez Catalina M.D.   On: 06/21/2014 17:09    Positive ROS: All other systems have been reviewed and were otherwise negative with the exception of those mentioned in the HPI and as above.  Labs cbc  Recent Labs  06/21/14 1835  WBC 8.2  HGB 11.4*  HCT 35.5*  PLT 213    Labs inflam No results for input(s): CRP in the last 72 hours.  Invalid input(s): ESR  Labs coag No results for input(s): INR, PTT in the last 72 hours.  Invalid input(s): PT  No results for input(s): NA, K, CL, CO2, GLUCOSE, BUN, CREATININE, CALCIUM in the last 72 hours.  Physical Exam: Filed Vitals:   06/21/14 1805  BP: 135/79  Pulse: 79  Temp: 98.3 F (36.8 C)  Resp: 18   General: Alert, no acute distress Cardiovascular: No pedal edema Respiratory: No cyanosis, no use of accessory musculature GI: No organomegaly, abdomen is soft and non-tender Skin: No lesions in the area of chief complaint other than those listed below in MSK exam.  Neurologic: Sensation intact distally Lymphatic: No axillary or cervical lymphadenopathy  MUSCULOSKELETAL:  LLE: painless small arc motion. SILT DP/SP/S/S/T nerve, 2+ DP, +TA/GS/EHL Compartments soft Other extremities are atraumatic with painless ROM and NVI.  Assessment: Left acetabular nondisplaced fracture  Plan:  Weight Bearing Status: TDWB PT to mobilize   Henry Cruz, D, MD Cell [phone]   06/21/2014 6:48 PM

## 2014-06-22 NOTE — Progress Notes (Signed)
Orthopedic Tech Progress Note Patient Details:  Henry Cruz 11/03/1924 [id number]  Patient ID: Henry Cruz, male   DOB: 1925-04-21, 80 y.o.   MRN: [id number] RN stated that pt is unable to use trapeze bar patient helper  Henry Cruz 06/22/2014, 9:29 AM

## 2014-06-23 ENCOUNTER — Inpatient Hospital Stay (HOSPITAL_COMMUNITY): Payer: Medicare HMO

## 2014-06-23 DIAGNOSIS — R410 Disorientation, unspecified: Secondary | ICD-10-CM

## 2014-06-23 LAB — BASIC METABOLIC PANEL
ANION GAP: 12 (ref 5–15)
BUN: 24 mg/dL — ABNORMAL HIGH (ref 6–23)
CALCIUM: 9 mg/dL (ref 8.4–10.5)
CO2: 21 mEq/L (ref 19–32)
CREATININE: 1.12 mg/dL (ref 0.50–1.35)
Chloride: 105 mEq/L (ref 96–112)
GFR calc Af Amer: 67 mL/min — ABNORMAL LOW (ref >90)
GFR, EST NON AFRICAN AMERICAN: 57 mL/min — AB (ref >90)
Glucose, Bld: 111 mg/dL — ABNORMAL HIGH (ref 70–99)
Potassium: 3.8 mEq/L (ref 3.7–5.3)
Sodium: 138 mEq/L (ref 137–147)

## 2014-06-23 LAB — CBC
HCT: 33.6 % — ABNORMAL LOW (ref 39.0–52.0)
Hemoglobin: 11 g/dL — ABNORMAL LOW (ref 13.0–17.0)
MCH: 31.3 pg (ref 26.0–34.0)
MCHC: 32.7 g/dL (ref 30.0–36.0)
MCV: 95.7 fL (ref 78.0–100.0)
PLATELETS: 210 10*3/uL (ref 150–400)
RBC: 3.51 MIL/uL — ABNORMAL LOW (ref 4.22–5.81)
RDW: 14.4 % (ref 11.5–15.5)
WBC: 7.7 10*3/uL (ref 4.0–10.5)

## 2014-06-23 MED ORDER — MORPHINE SULFATE 2 MG/ML IJ SOLN
1.0000 mg | Freq: Once | INTRAMUSCULAR | Status: AC
  Administered 2014-06-23: 1 mg via INTRAVENOUS
  Filled 2014-06-23: qty 1

## 2014-06-23 MED ORDER — LEVOFLOXACIN 500 MG PO TABS
500.0000 mg | ORAL_TABLET | Freq: Every day | ORAL | Status: DC
  Filled 2014-06-23: qty 1

## 2014-06-23 MED ORDER — LEVOFLOXACIN 500 MG PO TABS
500.0000 mg | ORAL_TABLET | ORAL | Status: DC
  Filled 2014-06-23: qty 1

## 2014-06-23 MED ORDER — BUDESONIDE-FORMOTEROL FUMARATE 160-4.5 MCG/ACT IN AERO
2.0000 | INHALATION_SPRAY | Freq: Two times a day (BID) | RESPIRATORY_TRACT | Status: DC
Start: 2014-06-23 — End: 2014-06-25
  Administered 2014-06-23 – 2014-06-25 (×4): 2 via RESPIRATORY_TRACT
  Filled 2014-06-23 (×2): qty 6

## 2014-06-23 MED ORDER — IPRATROPIUM-ALBUTEROL 0.5-2.5 (3) MG/3ML IN SOLN
3.0000 mL | RESPIRATORY_TRACT | Status: DC
  Administered 2014-06-23 – 2014-06-24 (×3): 3 mL via RESPIRATORY_TRACT
  Filled 2014-06-23 (×4): qty 3

## 2014-06-23 NOTE — Evaluation (Signed)
Physical Therapy Evaluation Patient Details Name: Vivek Grealish MRN: [id number] DOB: 1924-10-30 Today's Date: 06/23/2014   History of Present Illness  Fracture of L acetabulum; noted history of falls Past Medical History  Diagnosis Date  . Hypertension   . Hyperlipidemia   . Stroke   . Cancer      Clinical Impression  Pt admitted with above. Pt currently with functional limitations due to the deficits listed below (see PT Problem List).  Pt will benefit from skilled PT to increase their independence and safety with mobility to allow discharge to the venue listed below.       Follow Up Recommendations SNF    Equipment Recommendations  Rolling walker with 5" wheels;3in1 (PT);Wheelchair (measurements PT);Wheelchair cushion (measurements PT)    Recommendations for Other Services       Precautions / Restrictions Precautions Precautions: Fall Precaution Booklet Issued: No Restrictions Weight Bearing Restrictions: Yes Other Position/Activity Restrictions: TTWB on LLE      Mobility  Bed Mobility Overal bed mobility: +2 for physical assistance;Needs Assistance Bed Mobility: Supine to Sit     Supine to sit: +2 for physical assistance;Total assist     General bed mobility comments: Required total assist and close guard and assist for LLE to move toward EOB and transition to sit  Transfers Overall transfer level: Needs assistance Equipment used: Rolling walker (2 wheeled) Transfers: Sit to/from [employer] Sit to Stand: Mod assist   Squat pivot transfers: Mod assist     General transfer comment: Cues for technique and frequent reminders and guarding to keep weight off of LLE  Ambulation/Gait                Stairs            Wheelchair Mobility    Modified Rankin (Stroke Patients Only)       Balance Overall balance assessment: Needs assistance   Sitting balance-Leahy Scale: Fair     Standing balance support: Bilateral upper  extremity supported Standing balance-Leahy Scale: Zero                               Pertinent Vitals/Pain Pain Assessment: Faces Faces Pain Scale: Hurts even more Pain Location: LLE with Range of motion Pain Descriptors / Indicators: Grimacing Pain Intervention(s): Limited activity within patient's tolerance;Monitored during session;Repositioned    Home Living Family/patient expects to be discharged to:: Skilled nursing facility                 Additional Comments:  Dustin Flock)    Prior Function Level of Independence: Independent with assistive device(s)               Hand Dominance        Extremity/Trunk Assessment               Lower Extremity Assessment: LLE deficits/detail (Decreased AROMDecreased AROM and PROM (AROM is more limited))   LLE Deficits / Details: Decreased AROM and PROM (AROM is more limited)     Communication   Communication: Receptive difficulties;H[facility] Physiological scientist)  Cognition Arousal/Alertness: Awake/alert Behavior During Therapy: WFL for tasks assessed/performed Overall Cognitive Status: Within Functional Limits for tasks assessed       Memory: Decreased recall of precautions              General Comments General comments (skin integrity, edema, etc.): Pt. was on supplemental O2. O2 sat was observed at 82% at he  lowest after transfer. Sat was 94 at the highest during the session. 92% at end of session    Exercises General Exercises - Lower Extremity Heel Slides: 10 reps;PROM;AAROM;Left;Supine      Assessment/Plan    PT Assessment Patient needs continued PT services  PT Diagnosis Generalized weakness;Acute pain   PT Problem List Decreased strength;Decreased range of motion;Decreased activity tolerance;Decreased balance;Decreased mobility;Decreased coordination;Decreased knowledge of use of DME;Decreased safety awareness;Decreased knowledge of precautions;Pain;Cardiopulmonary status limiting activity   PT Treatment Interventions DME instruction;Functional mobility training;Therapeutic activities;Therapeutic exercise;Gait training;Balance training;Patient/family education;Wheelchair mobility training   PT Goals (Current goals can be found in the Care Plan section) Acute Rehab PT Goals Patient Stated Goal: Agreeable to OOB PT Goal Formulation: With patient Time For Goal Achievement: 07/07/14 Potential to Achieve Goals: Good    Frequency Min 3X/week   Barriers to discharge        Co-evaluation               End of Session Equipment Utilized During Treatment: Gait belt Activity Tolerance: Patient tolerated treatment well Patient left: in chair;with call bell/phone within reach;with family/visitor present;with chair alarm set Nurse Communication: Precautions;Weight bearing status         Time: 1425-1505 PT Time Calculation (min) (ACUTE ONLY): 40 min   Charges:   PT Evaluation $Initial PT Evaluation Tier I: 1 Procedure PT Treatments $Therapeutic Activity: 23-37 mins   PT G CodesQuin Hoop 06/23/2014, 5:28 PM  Roney [place], [place] Pager [phone] Office [phone]

## 2014-06-23 NOTE — Progress Notes (Addendum)
ANTIBIOTIC CONSULT NOTE - Follow Up  Pharmacy Consult for Levaquin  Indication: pneumonia  Allergies  Allergen Reactions  . Erythromycin Hives  . Vicodin [Hydrocodone-Acetaminophen] Other (See Comments)    See things   Patient Measurements: Weight: 143 lb 15.4 oz (65.3 kg) Adjusted Body Weight: n/a   Vital Signs: Temp: 97.8 F (36.6 C) (11/15 0627) Temp Source: Oral (11/15 0627) BP: 137/76 mmHg (11/15 0627) Pulse Rate: 79 (11/15 0627) Intake/Output from previous day: 11/14 0701 - 11/15 0700 In: 480 [P.O.:480] Out: 550 [Urine:550]  Labs:  Recent Labs  06/21/14 1835 06/22/14 0639 06/23/14 0600  WBC 8.2 8.5 7.7  HGB 11.4* 11.0* 11.0*  PLT 213 200 210  CREATININE 1.21 1.08 1.12   Estimated Creatinine Clearance: 43.7 mL/min (by C-G formula based on Cr of 1.12).  Medical History: Past Medical History  Diagnosis Date  . Hypertension   . Hyperlipidemia   . Stroke   . Cancer    Medications:  Prescriptions prior to admission  Medication Sig Dispense Refill Last Dose  . Acetaminophen (PAIN RELIEVER EXTRA STRENGTH PO) Take 2 tablets by mouth daily.   Past Month at Unknown time  . amLODipine (NORVASC) 5 MG tablet Take 5 mg by mouth daily.   06/21/2014 at Unknown time  . aspirin 81 MG tablet Take 81 mg by mouth daily.   06/22/2014 at Unknown time  . buPROPion (WELLBUTRIN XL) 150 MG 24 hr tablet Take 150 mg by mouth 2 (two) times daily.   06/21/2014 at Unknown time  . diclofenac sodium (VOLTAREN) 1 % GEL Apply 4 g topically 4 (four) times daily. To affected joint. 400 g 11 Past Month at Unknown time  . ibuprofen (ADVIL,MOTRIN) 200 MG tablet Take three tablets ( 600 milligrams total) every 6 with food as needed for pain. 30 tablet 0 Past Week at Unknown time  . lisinopril (PRINIVIL,ZESTRIL) 20 MG tablet Take 20 mg by mouth daily.   06/21/2014 at Unknown time  . Multiple Vitamins-Minerals (CENTRUM SILVER PO) Take 1 tablet by mouth daily.   06/21/2014 at Unknown time  .  rosuvastatin (CRESTOR) 10 MG tablet Take 10 mg by mouth daily.   06/21/2014 at Unknown time   Assessment: 47 YOM who presented with left hip fracture and a productive cough. Pharmacy consulted to start Levaquin as empiric therapy for CAP. WBC is wnl and pt is afebrile. CrCl ~ 40 mL/min.  CT of chest did not reveal infection but shows emphysema which may be contributing to sputum production.  He has no culture data.  He was started on Levaquin 750mg  IV every 48 hours which was adjusted for his renal function.  Current recommendations for duration of therapy with fluoroquinolones for CAP is 7-14 days.  Goal of Therapy:  Resolution of infection   Plan:  - Will change to Levaquin 500 mg PO daily  - Monitor CBC, renal fx, and patient's clinical progress - Consider LOT of 7 days  (11/21)  Rober Minion, PharmD., MS Clinical Pharmacist Pager:  [phone] Thank you for allowing pharmacy to be part of this patients care team.

## 2014-06-23 NOTE — Progress Notes (Signed)
Patient ID: Neel Buffone, male   DOB: 05/29/1925, 80 y.o.   MRN: [id number]     Subjective:  Patient reports pain as mild to moderate.  Patient is sleeping and does not wake easily then goes right back to sleep.  Wife at the bedside   Objective:   VITALS:   Filed Vitals:   06/22/14 2354 06/23/14 0627 06/23/14 0800 06/23/14 0857  BP:  137/76    Pulse:  79    Temp:  97.8 F (36.6 C)    TempSrc:  Oral    Resp: 16 16 16    Weight:      SpO2: 97% 95%  96%    ABD soft Sensation intact distally Dorsiflexion/Plantar flexion intact  EHL/FHL firing   Lab Results  Component Value Date   WBC 7.7 06/23/2014   HGB 11.0* 06/23/2014   HCT 33.6* 06/23/2014   MCV 95.7 06/23/2014   PLT 210 06/23/2014     Assessment/Plan:     Principal Problem:   Fracture of left acetabulum Active Problems:   Smoker   COPD (chronic obstructive pulmonary disease)   Acute encephalopathy   Recurrent falls   Essential hypertension   Dyslipidemia   Acute respiratory failure with hypoxia   Acute bronchitis   Advance diet  Bed to chair transfers Continue plan per medicine. Follow up with Dr [name] in 2 weeks. TTWB    Remonia Richter 06/23/2014, 11:00 AM   [name] MD [phone]

## 2014-06-23 NOTE — Progress Notes (Signed)
[facility] PROGRESS NOTE  Henry Cruz QAS:[id number] DOB: 03-Nov-1924 DOA: 06/21/2014 PCP: Houston Siren, MD  Assessment/Plan: 1. Left hip fracture. -Patient having an unwitnessed fall at home, with CT scan of left hip showing comminuted fracture in the left acetabulum without significant displacement. -orthopedic surgery consulted, recommending non-operative treatment for now -physical therapy consult placed  2.  Delirium -Patient becoming increasing confused, disoriented and agitated in the last 24 hours. -I suspect multifactorial with this hospitalization, pain, psychotropic medications all possibly contributing. Does not appear to have an active infection, chest x-ray and urinalysis were negative, he is afebrile, white count within normal limits. I am checking a repeat chest x-ray today. -physical therapy consult, will attempt to minimize psychotropic medications  3.  Status post fall -Patient having unwitnessed fall at home, has history recurrent falls -His wife denies any history of dementia or cognitive impairment, although I suspect there may be some mild cognitive impairment -physical therapy consultation placed  4.    Hypertension -Patient having elevated blood pressures over the course of the day I suspect pain may be driving his blood pressures up -Continue lisinopril 10 mg by mouth daily and amlodipine 5 mg by mouth daily  5.  Suspected bronchitis versus committed acquire pneumonia -Patient having cough with associated sputum production -He was started on Levaquin 750 mg IV every 48 hours -Plan to repeat chest x-ray today for follow-up  6.  Dyslipidemia -Continue Crestor  Code Status: DO NOT RESUSCITATE Family Communication: I spoke to his wife over telephone conversation, updated on patient's condition  Disposition Plan: continue supportive care, I suspect will need SNF placement   Consultants:  Orthopedic surgery  Antibiotics:  Levaquin 750 mg IV  (started on 06/21/2014)  HPI/Subjective: Patient is a pleasant 80 year old gentleman with a past medical history of dyslipidemia, hypertension, CVA admitted to the medicine service on 06/21/2014 presenting with left hip pain. He had an unwitnessed fall at home. Plain films revealed moderate symmetric narrowing of both hip joints no fracture or dislocation appreciated per radiology. This was followed up with a CT scan a left hip which showed comminuted fracture in the left acetabulum without significant displacement. Orthopedic surgery consulted.   Objective: Filed Vitals:   06/23/14 1158  BP:   Pulse:   Temp:   Resp: 16    Intake/Output Summary (Last 24 hours) at 06/23/14 1227 Last data filed at 06/23/14 0329  Gross per 24 hour  Intake    360 ml  Output    275 ml  Net     85 ml   Filed Weights   06/21/14 2134  Weight: 65.3 kg (143 lb 15.4 oz)    Exam:   General:  Patient appears more confused, disoriented, less interactive compared to yesterday  Cardiovascular: regular rate and rhythm normal S1-S2  Respiratory: diminished breath sounds bilaterally, positive crackles, scattered wheezes  Abdomen: soft nontender nondistended  Musculoskeletal: patient having pain withpassive and active movement of left hip  Data Reviewed: Basic Metabolic Panel:  Recent Labs Lab 06/21/14 1835 06/22/14 0639 06/23/14 0600  NA 144 142 138  K 4.2 4.2 3.8  CL 108 108 105  CO2 21 16* 21  GLUCOSE 112* 110* 111*  BUN 28* 26* 24*  CREATININE 1.21 1.08 1.12  CALCIUM 9.0 8.8 9.0   Liver Function Tests:  Recent Labs Lab 06/22/14 0639  AST 14  ALT 10  ALKPHOS 67  BILITOT 0.4  PROT 6.2  ALBUMIN 3.0*   No results for input(s): LIPASE, AMYLASE  in the last 168 hours.  Recent Labs Lab 06/21/14 2319  AMMONIA 10*   CBC:  Recent Labs Lab 06/21/14 1835 06/22/14 0639 06/23/14 0600  WBC 8.2 8.5 7.7  NEUTROABS 7.0 7.3  --   HGB 11.4* 11.0* 11.0*  HCT 35.5* 33.4* 33.6*  MCV 93.7  95.7 95.7  PLT 213 200 210   Cardiac Enzymes: No results for input(s): CKTOTAL, CKMB, CKMBINDEX, TROPONINI in the last 168 hours. BNP (last 3 results) No results for input(s): PROBNP in the last 8760 hours. CBG: No results for input(s): GLUCAP in the last 168 hours.  No results found for this or any previous visit (from the past 240 hour(s)).   Studies: Dg Hip Complete Left  06/21/2014   ADDENDUM REPORT: 06/21/2014 16:26  ADDENDUM: There is calcification in the right mid abdomen, due to peripherally calcified fluid collection arising adjacent to the right kidney as noted on CT examination May 24, 2014. There is an aortic stent graft present.   Electronically Signed   By: Lowella Grip M.D.   On: 06/21/2014 16:26   06/21/2014   CLINICAL DATA:  Patient fell twice in past 2 days with pain  EXAM: LEFT HIP - COMPLETE 2+ VIEW  COMPARISON:  None.  FINDINGS: Frontal pelvis as well as frontal and lateral left hip images were obtained. There is moderate symmetric narrowing of both hip joints. There is no appreciable fracture or dislocation. No erosive change. There is postoperative change in each inguinal region.  IMPRESSION: Moderate symmetric narrowing of both hip joints. No fracture or dislocation is appreciable on this study.  Electronically Signed: By: Lowella Grip M.D. On: 06/21/2014 16:18   Ct Head Wo Contrast  06/22/2014   CLINICAL DATA:  Patient fell in hospital room, initial evaluation,  EXAM: CT HEAD WITHOUT CONTRAST  TECHNIQUE: Contiguous axial images were obtained from the base of the skull through the vertex without intravenous contrast.  COMPARISON:  August 13, 2011  FINDINGS: No hemorrhage or extra-axial fluid. Diffuse atrophy and low attenuation in the deep white matter progressive when compared the prior study. Multiple chronic small lacunar infarcts in the deep white matter bilaterally. No skull fracture.  IMPRESSION: No acute traumatic injury.  Severe chronic involutional  change.   Electronically Signed   By: Skipper Cliche M.D.   On: 06/22/2014 00:02   Ct Angio Chest Pe W/cm &/or Wo Cm  06/21/2014   CLINICAL DATA:  Fall with acetabular fracture 2 days ago. Shortness of breath. Hypertension.  EXAM: CT ANGIOGRAPHY CHEST WITH CONTRAST  TECHNIQUE: Multidetector CT imaging of the chest was performed using the standard protocol during bolus administration of intravenous contrast. Multiplanar CT image reconstructions and MIPs were obtained to evaluate the vascular anatomy.  CONTRAST:  186mL OMNIPAQUE IOHEXOL 350 MG/ML SOLN  COMPARISON:  Radiograph of earlier today.  No prior CT.  FINDINGS: Lungs/Pleura: Mild degradation secondary to patient arm position, not raised above the head. There is also mild motion degradation throughout. This is most severe at the lung bases. Advanced centrilobular emphysema.  Right base atelectasis. No correlate for the right upper lobe opacity questioned on plain film within the lungs.  Trace left pleural fluid.  Heart/Mediastinum: The quality of this examination for evaluation of pulmonary embolism is good. Limitations are detailed above. The bolus is well timed. No evidence of pulmonary embolism to the large segmental level.  Pulmonary artery enlargement, with the outflow tract measuring 3.9 cm.  Aortic and branch vessel atherosclerosis. Tortuous thoracic aorta. Mild  ectasia of the ascending segment 4.1 cm. Tortuous descending segment. Moderate cardiomegaly with coronary artery atherosclerosis. No mediastinal or hilar adenopathy. Small hiatal hernia.  Upper Abdomen: Multiple hepatic cysts. Normal spleen, distal stomach, pancreas, gallbladder, biliary tract, adrenal glands. Bilateral renal lesions which were better characterized on the recent dedicated CT. No free intraperitoneal air or imaged fluid. Incompletely imaged abdominal aortic endograft repair.  Bones/Musculoskeletal: Posterior high right rib fractures are remote. There also anterior high right  rib fractures which are remote.  Review of the MIP images confirms the above findings.  IMPRESSION: 1. Degradation secondary to patient arm position and respiratory motion. 2. No evidence of pulmonary embolism to the large segmental level. 3. Centrilobular emphysema. 4. Pulmonary artery enlargement suggests pulmonary arterial hypertension. 5. Incompletely imaged aortic endograft repair. 6. Mild ectasia of the ascending aorta.   Electronically Signed   By: Abigail Miyamoto M.D.   On: 06/21/2014 21:03   Ct Hip Left Wo Contrast  06/21/2014   CLINICAL DATA:  [name] out of bed two days previous with left hip pain  EXAM: CT OF THE LEFT HIP WITHOUT CONTRAST  TECHNIQUE: Multidetector CT imaging of the left hip was performed according to the standard protocol. Multiplanar CT image reconstructions were also generated.  COMPARISON:  Plain film from earlier in the same day  FINDINGS: Generalized osteopenia is noted. Small left hip joint effusion is noted. The proximal left femur is well seated and within normal limits without fracture. There is however evidence of a comminuted fracture which involves the acetabulum and extends posteriorly, superiorly and anteriorly. It also extends medially to involve the junction with the superior pubic ramus on the left. No significant displacement of the fracture fragments is noted. No other focal abnormality is seen.  IMPRESSION: Comminuted fracture in the left acetabulum without significant displacement.  These results will be called to the ordering clinician or representative by the Radiologist Assistant, and communication documented in the PACS or zVision Dashboard.   Electronically Signed   By: Inez Catalina M.D.   On: 06/21/2014 17:09   Dg Chest Port 1 View  06/21/2014   CLINICAL DATA:  [name] June 19, 2014, productive cough for 1 week, image evaluation 03/07/2014  EXAM: PORTABLE CHEST - 1 VIEW  COMPARISON:  None.  FINDINGS: The heart size and vascular pattern are normal. Aortic  calcifications stable. Left lung is clear except for mono lower lobe scarring or atelectasis. On the right, there is also mild scarring or atelectasis in the lower lobe which is similar to the prior study  There is an approximately 2 cm rounded opacity projecting over the right upper lobe. It is possible that this is related to the anterior and of the second rib but a pulmonary nodule is not excluded.  There is a mildly to moderately displaced fracture of the posterior left sixth rib. This is not seen on the prior study.  IMPRESSION: Recommend CT thorax to evaluate for possible pulmonary nodule.  Left sixth rib fracture of uncertain age, but apparently new from March 07, 2014.   Electronically Signed   By: Skipper Cliche M.D.   On: 06/21/2014 19:10    Scheduled Meds: . amLODipine  5 mg Oral Daily  . budesonide-formoterol  2 puff Inhalation BID  . enoxaparin (LOVENOX) injection  40 mg Subcutaneous Q24H  . ipratropium-albuterol  3 mL Nebulization Q4H  . levofloxacin  500 mg Oral Daily  . lisinopril  10 mg Oral Daily  . nicotine  7 mg Transdermal  Q24H  . rosuvastatin  10 mg Oral Daily  . senna  1 tablet Oral BID   Continuous Infusions:   Principal Problem:   Fracture of left acetabulum Active Problems:   Smoker   COPD (chronic obstructive pulmonary disease)   Acute encephalopathy   Recurrent falls   Essential hypertension   Dyslipidemia   Acute respiratory failure with hypoxia   Acute bronchitis    Time spent: 35 min    Kelvin Cellar  [facility] Pager [phone]. If 7PM-7AM, please contact night-coverage at [email or web address], password [facility] 06/23/2014, 12:27 PM  LOS: 2 days

## 2014-06-24 ENCOUNTER — Inpatient Hospital Stay (HOSPITAL_COMMUNITY): Payer: Medicare HMO

## 2014-06-24 DIAGNOSIS — T17998D Other foreign object in respiratory tract, part unspecified causing other injury, subsequent encounter: Secondary | ICD-10-CM

## 2014-06-24 LAB — LEGIONELLA ANTIGEN, URINE

## 2014-06-24 LAB — CBC
HCT: 32.8 % — ABNORMAL LOW (ref 39.0–52.0)
Hemoglobin: 10.8 g/dL — ABNORMAL LOW (ref 13.0–17.0)
MCH: 31.4 pg (ref 26.0–34.0)
MCHC: 32.9 g/dL (ref 30.0–36.0)
MCV: 95.3 fL (ref 78.0–100.0)
Platelets: 248 10*3/uL (ref 150–400)
RBC: 3.44 MIL/uL — AB (ref 4.22–5.81)
RDW: 14.4 % (ref 11.5–15.5)
WBC: 7.9 10*3/uL (ref 4.0–10.5)

## 2014-06-24 LAB — BASIC METABOLIC PANEL
Anion gap: 13 (ref 5–15)
BUN: 25 mg/dL — ABNORMAL HIGH (ref 6–23)
CO2: 22 meq/L (ref 19–32)
Calcium: 9 mg/dL (ref 8.4–10.5)
Chloride: 108 mEq/L (ref 96–112)
Creatinine, Ser: 1.15 mg/dL (ref 0.50–1.35)
GFR calc Af Amer: 65 mL/min — ABNORMAL LOW (ref >90)
GFR calc non Af Amer: 56 mL/min — ABNORMAL LOW (ref >90)
GLUCOSE: 112 mg/dL — AB (ref 70–99)
Potassium: 4 mEq/L (ref 3.7–5.3)
Sodium: 143 mEq/L (ref 137–147)

## 2014-06-24 MED ORDER — RESOURCE THICKENUP CLEAR PO POWD
5.0000 g | ORAL | Status: DC | PRN
  Filled 2014-06-24 (×2): qty 125

## 2014-06-24 MED ORDER — PIPERACILLIN-TAZOBACTAM 3.375 G IVPB 30 MIN: 3.3750 g | Freq: Three times a day (TID) | INTRAVENOUS | Status: DC

## 2014-06-24 MED ORDER — PIPERACILLIN-TAZOBACTAM 3.375 G IVPB
3.3750 g | Freq: Three times a day (TID) | INTRAVENOUS | Status: DC
  Administered 2014-06-24 – 2014-06-25 (×4): 3.375 g via INTRAVENOUS
  Filled 2014-06-24 (×6): qty 50

## 2014-06-24 MED ORDER — IPRATROPIUM-ALBUTEROL 0.5-2.5 (3) MG/3ML IN SOLN
3.0000 mL | Freq: Three times a day (TID) | RESPIRATORY_TRACT | Status: DC
  Administered 2014-06-24 – 2014-06-25 (×3): 3 mL via RESPIRATORY_TRACT
  Filled 2014-06-24 (×3): qty 3

## 2014-06-24 NOTE — Progress Notes (Signed)
Physical Therapy Treatment Patient Details Name: Henry Cruz MRN: [id number] DOB: 1925-03-08 Today's Date: 06/24/2014    History of Present Illness Fracture of L acetabulum; pneumonia    PT Comments    The goal of this session  transfer training to maximize independence and safety with mobility, while maintaining TDWB precautions. We sat the patient upright OOB which aided in airway clearance, swallowing, and interacting with his family in the room. Better tolerance of moving noted today compared to yesterday;   Continue to recommend SNF for post-acute therapy needs.      Follow Up Recommendations  SNF     Equipment Recommendations  Rolling walker with 5" wheels;3in1 (PT);Wheelchair (measurements PT);Wheelchair cushion (measurements PT)    Recommendations for Other Services       Precautions / Restrictions Precautions Precautions: Fall Precaution Booklet Issued: No Restrictions Weight Bearing Restrictions: Yes Other Position/Activity Restrictions: TTWB on LLE    Mobility  Bed Mobility Overal bed mobility: +2 for physical assistance Bed Mobility: Supine to Sit     Supine to sit: +2 for physical assistance;Max assist     General bed mobility comments: Required L LE support and trunk assistance. Patient was able to bear weight through bilateral UE and R LE in coming to sit; much smoother transiition  Transfers Overall transfer level: Needs assistance   Transfers: Squat Pivot Transfers     Squat pivot transfers: Max assist     General transfer comment: Cues for UE placement and to push through R LE. Required R knee block during pivot transfer for stability; Patient demonstrated recall of L LE WB precautions  Ambulation/Gait                 Stairs            Wheelchair Mobility    Modified Rankin (Stroke Patients Only)       Balance Overall balance assessment: Needs assistance Sitting-balance support: Feet unsupported;Bilateral upper  extremity supported Sitting balance-Leahy Scale: Fair     Standing balance support: Bilateral upper extremity supported                        Cognition Arousal/Alertness: Awake/alert (asleep upon arrival, woken up by wife, awake/alert following) Behavior During Therapy: WFL for tasks assessed/performed Overall Cognitive Status: Within Functional Limits for tasks assessed                      Exercises General Exercises - Lower Extremity Quad Sets: 10 reps;Right;Left;Supine    General Comments General comments (skin integrity, edema, etc.): Patient demonstrated increased recall of TTWB precautions for L LE.       Pertinent Vitals/Pain Pain Assessment: 0-10 Pain Score: 0-No pain Faces Pain Scale: No hurt Pain Descriptors / Indicators: Sore Pain Intervention(s): Monitored during session  Pt was very nervous about pain associated with moving; remarkably, he did not have much pain during transition    Home Living                      Prior Function            PT Goals (current goals can now be found in the care plan section) Acute Rehab PT Goals Patient Stated Goal: Agreeable to OOB PT Goal Formulation: With patient/family    Frequency  Min 3X/week    PT Plan      Co-evaluation  End of Session Equipment Utilized During Treatment: Gait belt;Oxygen Activity Tolerance: Patient tolerated treatment well Patient left: in chair;with call bell/phone within reach;with bed alarm set;with family/visitor present     Time: 4496-7591 PT Time Calculation (min) (ACUTE ONLY): 30 min  Charges:                       G CodesDerrill Center, SPT  Acute Rehabilitation [phone]  Zdeb, Rachael 06/24/2014, 4:49 PM  Roney Marion, [place] Pager [phone] Office [phone]

## 2014-06-24 NOTE — Progress Notes (Signed)
[facility] PROGRESS NOTE  Kamauri Denardo OVZ:[id number] DOB: [birth year] DOA: 06/21/2014 PCP: Houston Siren, MD  Assessment/Plan: 1. Left hip fracture. -Patient having an unwitnessed fall at home, with CT scan of left hip showing comminuted fracture in the left acetabulum without significant displacement. -orthopedic surgery consulted, recommending non-operative treatment for now -physical therapy recommending SNF placement  2.  Suspected aspiration pneumonia -Patient have an episode of nausea and vomiting at home after which he fell. -Chest x-ray performed on 06/23/2014 showing developing bibasilar airspace disease suspicious for infection or aspiration. -I discontinued Levaquin, started Zosyn -Speech pathology consulted; patient having moderate pharyngeal phase dysphagia recommending a dysphasia 3 nectar thick liquid -Repeat CXR in AM  2.  Delirium -Improved, likely multifactorial with hospitalization, narcotic analgesics, and possibly underlying infection contributing.   3.  Status post fall -Patient having unwitnessed fall at home, has history recurrent falls -His wife denies any history of dementia or cognitive impairment, although I suspect there may be some mild cognitive impairment -Plan for SNF placement  4.    Hypertension -Patient having elevated blood pressures over the course of the day I suspect pain may be driving his blood pressures up -Continue lisinopril 10 mg by mouth daily and amlodipine 5 mg by mouth daily  5.  Suspected bronchitis versus committed acquire pneumonia -Patient having cough with associated sputum production -He was started on Levaquin 750 mg IV every 48 hours -Plan to repeat chest x-ray today for follow-up  6.  Dyslipidemia -Continue Crestor  Code Status: DO NOT RESUSCITATE Family Communication: I spoke to his wife over telephone conversation, updated on patient's condition  Disposition Plan: continue supportive care, I suspect will  need SNF placement   Consultants:  Orthopedic surgery  Antibiotics:  Levaquin 750 mg IV (started on 06/21/2014)  HPI/Subjective: Patient is a pleasant 80 year old gentleman with a past medical history of dyslipidemia, hypertension, CVA admitted to the medicine service on 06/21/2014 presenting with left hip pain. He had an unwitnessed fall at home. Plain films revealed moderate symmetric narrowing of both hip joints no fracture or dislocation appreciated per radiology. This was followed up with a CT scan a left hip which showed comminuted fracture in the left acetabulum without significant displacement. Orthopedic surgery consulted.   Objective: Filed Vitals:   06/24/14 1300  BP: 104/64  Pulse: 78  Temp: 97.8 F (36.6 C)  Resp: 16    Intake/Output Summary (Last 24 hours) at 06/24/14 1706 Last data filed at 06/24/14 1032  Gross per 24 hour  Intake    290 ml  Output      0 ml  Net    290 ml   Filed Weights   06/21/14 2134 06/24/14 1100  Weight: 65.3 kg (143 lb 15.4 oz) 65.3 kg (143 lb 15.4 oz)    Exam:   General:  Patient is less confused, following commands, overall seems better  Cardiovascular: regular rate and rhythm normal S1-S2  Respiratory: diminished breath sounds bilaterally, positive crackles, scattered wheezes  Abdomen: soft nontender nondistended  Musculoskeletal: patient having pain withpassive and active movement of left hip  Data Reviewed: Basic Metabolic Panel:  Recent Labs Lab 06/21/14 1835 06/22/14 0639 06/23/14 0600 06/24/14 0454  NA 144 142 138 143  K 4.2 4.2 3.8 4.0  CL 108 108 105 108  CO2 21 16* 21 22  GLUCOSE 112* 110* 111* 112*  BUN 28* 26* 24* 25*  CREATININE 1.21 1.08 1.12 1.15  CALCIUM 9.0 8.8 9.0 9.0   Liver Function Tests:  Recent Labs Lab 06/22/14 0639  AST 14  ALT 10  ALKPHOS 67  BILITOT 0.4  PROT 6.2  ALBUMIN 3.0*   No results for input(s): LIPASE, AMYLASE in the last 168 hours.  Recent Labs Lab  06/21/14 2319  AMMONIA 10*   CBC:  Recent Labs Lab 06/21/14 1835 06/22/14 0639 06/23/14 0600 06/24/14 0454  WBC 8.2 8.5 7.7 7.9  NEUTROABS 7.0 7.3  --   --   HGB 11.4* 11.0* 11.0* 10.8*  HCT 35.5* 33.4* 33.6* 32.8*  MCV 93.7 95.7 95.7 95.3  PLT 213 200 210 248   Cardiac Enzymes: No results for input(s): CKTOTAL, CKMB, CKMBINDEX, TROPONINI in the last 168 hours. BNP (last 3 results) No results for input(s): PROBNP in the last 8760 hours. CBG: No results for input(s): GLUCAP in the last 168 hours.  No results found for this or any previous visit (from the past 240 hour(s)).   Studies: Dg Chest 1v Repeat Same Day  06/23/2014   CLINICAL DATA:  Rule out pneumonia. Cough and congestion since this morning.  EXAM: CHEST - 1 VIEW SAME DAY  COMPARISON:  CT of 06/21/2014 and plain film of 06/21/2014  FINDINGS: Midline trachea. moderate cardiomegaly. Possible small left pleural effusion. No pneumothorax. Worsened bibasilar aeration with increased left greater than right airspace disease.  IMPRESSION: Developing bibasilar airspace disease. Suspicious for infection or aspiration, especially on the left.  Possible small left pleural effusion.   Electronically Signed   By: Abigail Miyamoto M.D.   On: 06/23/2014 14:42   Dg Swallowing Func-speech Pathology  06/24/2014   Eden Emms, [place]     06/24/2014  3:12 PM Objective Swallowing Evaluation: Modified Barium Swallowing Study   Patient Details  Name: Donivin Wirt MRN: [id number] Date of Birth: 08-25-1924  Today's Date: 06/24/2014 Time: 1401-1430 SLP Time Calculation (min) (ACUTE ONLY): 29 min  Past Medical History:  Past Medical History  Diagnosis Date  . Hypertension   . Hyperlipidemia   . Stroke   . Cancer    Past Surgical History:  Past Surgical History  Procedure Laterality Date  . Hernia repair    . Aorta surgery     HPI:  Patient is a pleasant 80 year old gentleman with a past medical  history of dyslipidemia, hypertension, CVA  admitted to the  medicine service on 06/21/2014 presenting with left hip pain. He  had an unwitnessed fall at home. Plain films revealed moderate  symmetric narrowing of both hip joints no fracture or dislocation  appreciated per radiology. This was followed up with a CT scan a  left hip which showed comminuted fracture in the left acetabulum  without significant displacement. concern for pna, CT shows  developing bibasilar airspace disease, Suspicious for infection  or     Assessment / Plan / Recommendation Clinical Impression  Dysphagia Diagnosis: Moderate pharyngeal phase dysphagia  Pt demonstrates an age related  pharyngeal dysphagia likely  exacerbated by decreased respiratory status leading to decreased  apneic period and delayed initiation of swallow. He consistently  silently aspirates when given trials of thin liquids. SLP  provided cueing for chin tuck which was not effective in  preventing penetration/aspiration with thin liquids, but was  effective in preventing penetration/aspiration with trials of  nectar thick liquids. Recommend dysphagia 3 (mechanical soft)  diet and nectar thick liquids with chin tuck. SLP will follow up  with compensatory strategy training, family education regarding  liquid thickening, and diet advancement as appropriate.    Treatment Recommendation  Therapy  as outlined in treatment plan below    Diet Recommendation Dysphagia 3 (Mechanical Soft);Nectar-thick  liquid   Liquid Administration via: Cup;Straw Medication Administration: Whole meds with puree Supervision: Full supervision/cueing for compensatory strategies Compensations: Slow rate;Small sips/bites Postural Changes and/or Swallow Maneuvers: Chin tuck;Seated  upright 90 degrees    Other  Recommendations Oral Care Recommendations: Oral care BID   Follow Up Recommendations  Skilled Nursing facility    Frequency and Duration min 2x/week  2 weeks   Pertinent Vitals/Pain none    SLP Swallow Goals     General Date of Onset:  06/21/14 HPI: Patient is a pleasant 80 year old gentleman with a past  medical history of dyslipidemia, hypertension, CVA admitted to  the medicine service on 06/21/2014 presenting with left hip pain.  He had an unwitnessed fall at home. Plain films revealed moderate  symmetric narrowing of both hip joints no fracture or dislocation  appreciated per radiology. This was followed up with a CT scan a  left hip which showed comminuted fracture in the left acetabulum  without significant displacement. concern for pna, CT shows  developing bibasilar airspace disease, Suspicious for infection  or Type of Study: Modified Barium Swallowing Study Reason for Referral: Objectively evaluate swallowing function Previous Swallow Assessment: no Diet Prior to this Study: NPO Temperature Spikes Noted: No Respiratory Status: Nasal cannula History of Recent Intubation: No Behavior/Cognition: Alert;Cooperative;Pleasant mood Oral Cavity - Dentition: Dentures, top;Dentures, bottom Oral Motor / Sensory Function: Within functional limits Self-Feeding Abilities: Able to feed self Patient Positioning: Upright in chair Baseline Vocal Quality: Clear Volitional Cough: Strong;Congested Volitional Swallow: Able to elicit Anatomy: Within functional limits    Reason for Referral Objectively evaluate swallowing function   Oral Phase Oral Preparation/Oral Phase Oral Phase: WFL Oral - Nectar Oral - Nectar Cup: Within functional limits Oral - Nectar Straw: Within functional limits Oral - Thin Oral - Thin Cup: Within functional limits Oral - Solids Oral - Puree: Within functional limits Oral - Regular: Within functional limits Oral - Pill: Within functional limits   Pharyngeal Phase Pharyngeal Phase Pharyngeal Phase: Impaired Pharyngeal - Honey Pharyngeal - Honey Cup: Delayed swallow initiation;Premature  spillage to valleculae Pharyngeal - Nectar Pharyngeal - Nectar Cup: Delayed swallow initiation;Premature  spillage to pyriform  sinuses;Penetration/Aspiration during  swallow;Moderate aspiration;Compensatory strategies attempted  (Comment) (chin tuck, effortful swallow) Penetration/Aspiration details (nectar cup): Material enters  airway, remains ABOVE vocal cords and not ejected out Pharyngeal - Nectar Straw: Delayed swallow initiation;Premature  spillage to pyriform sinuses;Penetration/Aspiration during  swallow;Moderate aspiration;Compensatory strategies attempted  (Comment) Penetration/Aspiration details (nectar straw): Material enters  airway, passes BELOW cords without attempt by patient to eject  out (silent aspiration) Pharyngeal - Thin Pharyngeal - Thin Cup: Delayed swallow initiation;Premature  spillage to pyriform sinuses;Penetration/Aspiration during  swallow;Compensatory strategies attempted (Comment) Penetration/Aspiration details (thin cup): Material enters  airway, remains ABOVE vocal cords and not ejected out Pharyngeal - Solids Pharyngeal - Puree: Within functional limits Pharyngeal - Regular: Within functional limits Pharyngeal - Pill: Delayed swallow initiation;Premature spillage  to pyriform sinuses;Penetration/Aspiration before swallow  Cervical Esophageal Phase    GO    Cervical Esophageal Phase Cervical Esophageal Phase: Delfina Redwood 06/24/2014, 3:05 PM     Scheduled Meds: . amLODipine  5 mg Oral Daily  . budesonide-formoterol  2 puff Inhalation BID  . enoxaparin (LOVENOX) injection  40 mg Subcutaneous Q24H  . ipratropium-albuterol  3 mL Nebulization TID  . lisinopril  10 mg Oral Daily  .  nicotine  7 mg Transdermal Q24H  . piperacillin-tazobactam (ZOSYN)  IV  3.375 g Intravenous Q8H  . rosuvastatin  10 mg Oral Daily  . senna  1 tablet Oral BID   Continuous Infusions:   Principal Problem:   Fracture of left acetabulum Active Problems:   Smoker   COPD (chronic obstructive pulmonary disease)   Acute encephalopathy   Recurrent falls   Essential hypertension   Dyslipidemia   Acute  respiratory failure with hypoxia   Acute bronchitis    Time spent: 35 min    Kelvin Cellar  [facility] Pager [phone]. If 7PM-7AM, please contact night-coverage at [email or web address], password [facility] 06/24/2014, 5:06 PM  LOS: 3 days

## 2014-06-24 NOTE — Clinical Social Work Placement (Addendum)
Clinical Social Work Department CLINICAL SOCIAL WORK PLACEMENT NOTE 06/24/2014  Patient:  Henry Cruz,Henry Cruz  Account Number:  [id number] Admit date:  06/21/2014  Clinical Social Worker:  Delrae Sawyers  Date/time:  06/24/2014 12:47 PM  Clinical Social Work is seeking post-discharge placement for this patient at the following level of care:   [place]   (*CSW will update this form in Epic as items are completed)   06/24/2014  Patient/family provided with [facility] Department of Clinical Social Work's list of facilities offering this level of care within the geographic area requested by the patient (or if unable, by the patient's family).  06/24/2014  Patient/family informed of their freedom to choose among providers that offer the needed level of care, that participate in Medicare, Medicaid or managed care program needed by the patient, have an available bed and are willing to accept the patient.  06/24/2014  Patient/family informed of [facility]' ownership interest in [facility], as well as of the fact that they are under no obligation to receive care at this facility.  PASARR submitted to EDS on 06/24/2014 PASARR number received on 06/24/2014  FL2 transmitted to all facilities in geographic area requested by pt/family on  06/24/2014 FL2 transmitted to all facilities within larger geographic area on   Patient informed that his/her managed care company has contracts with or will negotiate with  certain facilities, including the following:     Patient/family informed of bed offers received:  06/24/2014 Patient chooses bed at [facility] Physician recommends and patient chooses bed at    Patient to be transferred to  [facility] SNF on  06/25/2014 Patient to be transferred to facility by [facility] Patient and family notified of transfer on 06/25/2014 Name of family member notified:   Pt and pt's wife notified at bedside.  The following physician request  were entered in Epic:   Additional Comments:  [facility] [place] ([phone]) Licensed Clinical Social Worker Orthopedics [phone]) and Surgical [phone])

## 2014-06-24 NOTE — Clinical Social Work Psychosocial (Signed)
Clinical Social Work Department BRIEF PSYCHOSOCIAL ASSESSMENT 06/24/2014  Patient:  Henry Cruz,Henry Cruz     Account Number:  [id number]     Admit date:  06/21/2014  Clinical Social Worker:  , S, LCSWA  Date/Time:  06/24/2014 12:41 PM  Referred by:  Physician  Date Referred:  06/24/2014 Referred for  SNF Placement   Other Referral:   none.   Interview type:  Patient Other interview type:   Pt's daughter and wife (Helen) also present at bedside.    PSYCHOSOCIAL DATA Living Status:  WIFE Admitted from facility:   Level of care:   Primary support name:  Helen Leighton Primary support relationship to patient:  SPOUSE Degree of support available:   Strong support system.    CURRENT CONCERNS Current Concerns  Post-Acute Placement   Other Concerns:   none.    SOCIAL WORK ASSESSMENT / PLAN CSW received consult for possible SNF placement at time of discharge. CSW met with pt, pt's wife, and pt's daughter at bedside. Pt's wife assisted with completion of assessment as pt presented as drowsy throughout conversation. Pt's wife informed CSW pt has previously completed short-term rehabilitation at [facility] and would prefer placement at [facility]. Pt and pt's family plan for pt to return home with home health services once discharged from SNF.    CSW to continue to follow and assist with discharge planning needs.   Assessment/plan status:  Psychosocial Support/Ongoing Assessment of Needs Other assessment/ plan:   none.   Information/referral to community resources:   [place] SN[facility] bed offers. CSW contacted [facility] admissions liaison regarding pt's family request.    PATIENT'S/FAMILY'S RESPONSE TO PLAN OF CARE: Pt and pt's family understanding and agreeable to CSW plan of care. Pt and pt's family expressed no further questions or concerns at this time.        , LCSWA ([phone]) Licensed Clinical Social Worker Orthopedics ([id number]) and  Surgical ([id number])  

## 2014-06-24 NOTE — Progress Notes (Signed)
Utilization review completed.  

## 2014-06-24 NOTE — Evaluation (Signed)
Clinical/Bedside Swallow Evaluation Patient Details  Name: Henry Cruz MRN: [id number] Date of Birth: May 13, 1925  Today's Date: 06/24/2014 Time: 1015-1037 SLP Time Calculation (min) (ACUTE ONLY): 22 min  Past Medical History:  Past Medical History  Diagnosis Date  . Hypertension   . Hyperlipidemia   . Stroke   . Cancer    Past Surgical History:  Past Surgical History  Procedure Laterality Date  . Hernia repair    . Aorta surgery     HPI:  Patient is a pleasant 80 year old gentleman with a past medical history of dyslipidemia, hypertension, CVA admitted to the medicine service on 06/21/2014 presenting with left hip pain. He had an unwitnessed fall at home. Plain films revealed moderate symmetric narrowing of both hip joints no fracture or dislocation appreciated per radiology. This was followed up with a CT scan a left hip which showed comminuted fracture in the left acetabulum without significant displacement. concern for pna, CT shows developing bibasilar airspace disease, Suspicious for infection or   Assessment / Plan / Recommendation Clinical Impression   Pt demonstrates need for objective swallow evaluation. SLP provided trials of thin liquid and puree. Pt showed signs of possible aspiration (wet vocal quality/delayed cough), as well as intermittently delayed swallow. Given these clinical signs and pt's chest x-ray findings (Developing bibasilar airspace disease, suspicious for infection or Aspiration), recommend MBSS to better evaluate swallow function and determine appropriate diet recommendation.   Aspiration Risk  Moderate    Diet Recommendation NPO        Other  Recommendations     Follow Up Recommendations       Frequency and Duration min 2x/week  2 weeks   Pertinent Vitals/Pain none    SLP Swallow Goals     Swallow Study Prior Functional Status       General Date of Onset: 06/21/14 HPI: Patient is a pleasant 80 year old gentleman with a past  medical history of dyslipidemia, hypertension, CVA admitted to the medicine service on 06/21/2014 presenting with left hip pain. He had an unwitnessed fall at home. Plain films revealed moderate symmetric narrowing of both hip joints no fracture or dislocation appreciated per radiology. This was followed up with a CT scan a left hip which showed comminuted fracture in the left acetabulum without significant displacement. concern for pna, CT shows developing bibasilar airspace disease, Suspicious for infection or Type of Study: Bedside swallow evaluation Previous Swallow Assessment: no Diet Prior to this Study: NPO Temperature Spikes Noted: No Respiratory Status: Nasal cannula History of Recent Intubation: No Behavior/Cognition: Alert;Cooperative;Pleasant mood Oral Cavity - Dentition: Adequate natural dentition Self-Feeding Abilities: Able to feed self;Needs assist Patient Positioning: Upright in bed Baseline Vocal Quality: Clear Volitional Cough: Strong;Congested Volitional Swallow: Able to elicit    Oral/Motor/Sensory Function Overall Oral Motor/Sensory Function: Appears within functional limits for tasks assessed   Ice Chips     Thin Liquid Thin Liquid: Impaired Presentation: Cup;Straw Pharyngeal  Phase Impairments: Suspected delayed Swallow;Wet Vocal Quality;Cough - Delayed    Nectar Thick     Honey Thick     Puree Puree: Within functional limits   Solid   GO            Eden Emms 06/24/2014,12:00 PM

## 2014-06-24 NOTE — Procedures (Signed)
Objective Swallowing Evaluation: Modified Barium Swallowing Study  Patient Details  Name: Henry Cruz MRN: [id number] Date of Birth: [birth year]  Today's Date: 06/24/2014 Time: 1401-1430 SLP Time Calculation (min) (ACUTE ONLY): 29 min  Past Medical History:  Past Medical History  Diagnosis Date  . Hypertension   . Hyperlipidemia   . Stroke   . Cancer    Past Surgical History:  Past Surgical History  Procedure Laterality Date  . Hernia repair    . Aorta surgery     HPI:  Patient is a pleasant 80 year old gentleman with a past medical history of dyslipidemia, hypertension, CVA admitted to the medicine service on 06/21/2014 presenting with left hip pain. He had an unwitnessed fall at home. Plain films revealed moderate symmetric narrowing of both hip joints no fracture or dislocation appreciated per radiology. This was followed up with a CT scan a left hip which showed comminuted fracture in the left acetabulum without significant displacement. concern for pna, CT shows developing bibasilar airspace disease, Suspicious for infection or     Assessment / Plan / Recommendation Clinical Impression  Dysphagia Diagnosis: Moderate pharyngeal phase dysphagia  Pt demonstrates an age related  pharyngeal dysphagia likely exacerbated by decreased respiratory status leading to decreased apneic period and delayed initiation of swallow. He consistently silently aspirates when given trials of thin liquids. SLP provided cueing for chin tuck which was not effective in preventing penetration/aspiration with thin liquids, but was effective in preventing penetration/aspiration with trials of nectar thick liquids. Recommend dysphagia 3 (mechanical soft) diet and nectar thick liquids with chin tuck. SLP will follow up with compensatory strategy training, family education regarding liquid thickening, and diet advancement as appropriate.    Treatment Recommendation  Therapy as outlined in treatment plan below     Diet Recommendation Dysphagia 3 (Mechanical Soft);Nectar-thick liquid   Liquid Administration via: Cup;Straw Medication Administration: Whole meds with puree Supervision: Full supervision/cueing for compensatory strategies Compensations: Slow rate;Small sips/bites Postural Changes and/or Swallow Maneuvers: Chin tuck;Seated upright 90 degrees    Other  Recommendations Oral Care Recommendations: Oral care BID   Follow Up Recommendations  Skilled Nursing facility    Frequency and Duration min 2x/week  2 weeks   Pertinent Vitals/Pain none    SLP Swallow Goals     General Date of Onset: 06/21/14 HPI: Patient is a pleasant 80 year old gentleman with a past medical history of dyslipidemia, hypertension, CVA admitted to the medicine service on 06/21/2014 presenting with left hip pain. He had an unwitnessed fall at home. Plain films revealed moderate symmetric narrowing of both hip joints no fracture or dislocation appreciated per radiology. This was followed up with a CT scan a left hip which showed comminuted fracture in the left acetabulum without significant displacement. concern for pna, CT shows developing bibasilar airspace disease, Suspicious for infection or Type of Study: Modified Barium Swallowing Study Reason for Referral: Objectively evaluate swallowing function Previous Swallow Assessment: no Diet Prior to this Study: NPO Temperature Spikes Noted: No Respiratory Status: Nasal cannula History of Recent Intubation: No Behavior/Cognition: Alert;Cooperative;Pleasant mood Oral Cavity - Dentition: Dentures, top;Dentures, bottom Oral Motor / Sensory Function: Within functional limits Self-Feeding Abilities: Able to feed self Patient Positioning: Upright in chair Baseline Vocal Quality: Clear Volitional Cough: Strong;Congested Volitional Swallow: Able to elicit Anatomy: Within functional limits    Reason for Referral Objectively evaluate swallowing function   Oral Phase  Oral Preparation/Oral Phase Oral Phase: WFL Oral - Nectar Oral - Nectar Cup: Within functional limits Oral - Nectar Straw:  Within functional limits Oral - Thin Oral - Thin Cup: Within functional limits Oral - Solids Oral - Puree: Within functional limits Oral - Regular: Within functional limits Oral - Pill: Within functional limits   Pharyngeal Phase Pharyngeal Phase Pharyngeal Phase: Impaired Pharyngeal - Honey Pharyngeal - Honey Cup: Delayed swallow initiation;Premature spillage to valleculae Pharyngeal - Nectar Pharyngeal - Nectar Cup: Delayed swallow initiation;Premature spillage to pyriform sinuses;Penetration/Aspiration during swallow;Moderate aspiration;Compensatory strategies attempted (Comment) (chin tuck, effortful swallow) Penetration/Aspiration details (nectar cup): Material enters airway, remains ABOVE vocal cords and not ejected out Pharyngeal - Nectar Straw: Delayed swallow initiation;Premature spillage to pyriform sinuses;Penetration/Aspiration during swallow;Moderate aspiration;Compensatory strategies attempted (Comment) Penetration/Aspiration details (nectar straw): Material enters airway, passes BELOW cords without attempt by patient to eject out (silent aspiration) Pharyngeal - Thin Pharyngeal - Thin Cup: Delayed swallow initiation;Premature spillage to pyriform sinuses;Penetration/Aspiration during swallow;Compensatory strategies attempted (Comment) Penetration/Aspiration details (thin cup): Material enters airway, remains ABOVE vocal cords and not ejected out Pharyngeal - Solids Pharyngeal - Puree: Within functional limits Pharyngeal - Regular: Within functional limits Pharyngeal - Pill: Delayed swallow initiation;Premature spillage to pyriform sinuses;Penetration/Aspiration before swallow  Cervical Esophageal Phase    GO    Cervical Esophageal Phase Cervical Esophageal Phase: Henry Cruz 06/24/2014, 3:05 PM

## 2014-06-25 ENCOUNTER — Telehealth: Payer: Self-pay

## 2014-06-25 ENCOUNTER — Inpatient Hospital Stay (HOSPITAL_COMMUNITY): Payer: Medicare HMO

## 2014-06-25 DIAGNOSIS — J690 Pneumonitis due to inhalation of food and vomit: Secondary | ICD-10-CM | POA: Insufficient documentation

## 2014-06-25 LAB — BASIC METABOLIC PANEL
Anion gap: 14 (ref 5–15)
BUN: 24 mg/dL — AB (ref 6–23)
CALCIUM: 9.1 mg/dL (ref 8.4–10.5)
CO2: 21 meq/L (ref 19–32)
Chloride: 109 mEq/L (ref 96–112)
Creatinine, Ser: 1.24 mg/dL (ref 0.50–1.35)
GFR calc Af Amer: 59 mL/min — ABNORMAL LOW (ref >90)
GFR calc non Af Amer: 51 mL/min — ABNORMAL LOW (ref >90)
GLUCOSE: 109 mg/dL — AB (ref 70–99)
Potassium: 4.1 mEq/L (ref 3.7–5.3)
Sodium: 144 mEq/L (ref 137–147)

## 2014-06-25 LAB — CBC
HCT: 33.6 % — ABNORMAL LOW (ref 39.0–52.0)
Hemoglobin: 10.6 g/dL — ABNORMAL LOW (ref 13.0–17.0)
MCH: 29.8 pg (ref 26.0–34.0)
MCHC: 31.5 g/dL (ref 30.0–36.0)
MCV: 94.4 fL (ref 78.0–100.0)
PLATELETS: 269 10*3/uL (ref 150–400)
RBC: 3.56 MIL/uL — ABNORMAL LOW (ref 4.22–5.81)
RDW: 14.4 % (ref 11.5–15.5)
WBC: 7.6 10*3/uL (ref 4.0–10.5)

## 2014-06-25 MED ORDER — AMOXICILLIN-POT CLAVULANATE 875-125 MG PO TABS: 1.0000 | ORAL_TABLET | Freq: Two times a day (BID) | ORAL | Status: AC

## 2014-06-25 MED ORDER — BUDESONIDE-FORMOTEROL FUMARATE 160-4.5 MCG/ACT IN AERO: 2.0000 | INHALATION_SPRAY | Freq: Two times a day (BID) | RESPIRATORY_TRACT | Status: AC

## 2014-06-25 MED ORDER — SENNA 8.6 MG PO TABS: 1.0000 | ORAL_TABLET | Freq: Two times a day (BID) | ORAL | Status: AC

## 2014-06-25 MED ORDER — RESOURCE THICKENUP CLEAR PO POWD: 5.0000 g | ORAL | Status: AC | PRN

## 2014-06-25 NOTE — Clinical Social Work Note (Signed)
Pt to be discharged to [facility]. Pt and pt's wife updated at bedside.  [employer] Medicare authorization: [id number]  [facility]: [phone] Transportation: EMS [facility]) scheduled for 2pm.  Lubertha Sayres, Latanya Presser [phone]) Licensed Clinical Social Worker Orthopedics [phone]) and Surgical [phone])

## 2014-06-25 NOTE — Clinical Social Work Note (Signed)
CSW notified by RN pt has not been picked-up by EMS ([facility]). CSW contacted EMS ([facility]) regarding scheduled 2pm pick-up. [facility] representative stated EMS ([facility]) was "slammed" and pt was "towards the top of the list." CSW met with pt and pt's family (wife and daughter) at bedside regarding discharge concern. CSW offered apologies. Pt's family understanding and appreciative of CSW updating family.  RNCM and Charge RN updated regarding information above.  Lubertha Sayres, [place] [phone]) Licensed Clinical Social Worker Orthopedics [phone]) and Surgical [phone])

## 2014-06-25 NOTE — Plan of Care (Signed)
Problem: Phase I Progression Outcomes Goal: OOB as tolerated unless otherwise ordered Outcome: Completed/Met Date Met:  06/25/14  Problem: Phase II Progression Outcomes Goal: IV changed to normal saline lock Outcome: Not Applicable Date Met:  66/44/03  Problem: Phase III Progression Outcomes Goal: IV/normal saline lock discontinued Outcome: Not Applicable Date Met:  47/42/59 Goal: Discharge plan remains appropriate-arrangements made Outcome: Completed/Met Date Met:  06/25/14 Goal: Other Phase III Outcomes/Goals Outcome: Completed/Met Date Met:  06/25/14  Problem: Discharge Progression Outcomes Goal: Discharge plan in place and appropriate Outcome: Completed/Met Date Met:  06/25/14 Goal: Pain controlled with appropriate interventions Outcome: Completed/Met Date Met:  06/25/14 Goal: Hemodynamically stable Outcome: Completed/Met Date Met:  56/38/75 Goal: Complications resolved/controlled Outcome: Completed/Met Date Met:  06/25/14 Goal: Tolerating diet Outcome: Completed/Met Date Met:  06/25/14 Goal: Activity appropriate for discharge plan Outcome: Completed/Met Date Met:  06/25/14 Goal: Other Discharge Outcomes/Goals Outcome: Not Applicable Date Met:  64/33/29

## 2014-06-25 NOTE — Progress Notes (Signed)
Nutrition Brief Note  Reason for assessment: Pt was identified on the malnutrition screening tool.  Pt to be discharged today. Spoke with pt and his wife. Educated on oral supplements that are appropriate with pt's diet and thickened liquids. Discussed importance of adequate protein and calorie intake to help prevent weight loss especially when appetite is decreased. Pt and wife expressed understanding.  Kallie Locks, MS, RD, LDN Pager # [phone] After hours/ weekend pager # [phone]

## 2014-06-25 NOTE — Plan of Care (Signed)
Problem: Phase I Progression Outcomes Goal: Pain controlled with appropriate interventions Outcome: Completed/Met Date Met:  06/25/14

## 2014-06-25 NOTE — Telephone Encounter (Signed)
PA required for CT lower extremity without dye left. Approval # [id number]

## 2014-06-25 NOTE — Progress Notes (Signed)
Discharge plan discussed with pt and family, questions were answered. And pt's wife and daughter are on the bedside, stated they are ready to go.

## 2014-06-25 NOTE — Discharge Summary (Signed)
Physician Discharge Summary  Henry Cruz JOI:[id number] DOB: 02-03-1925 DOA: 06/21/2014  PCP: Houston Siren, MD  Admit date: 06/21/2014 Discharge date: 06/25/2014  Time spent: 35 minutes  Recommendations for Outpatient Follow-up:  1. Please repeat CBC and BMP in 3-4 days 2. Please continue Augmentin for 5 days then stop   Discharge Diagnoses:  Principal Problem:   Fracture of left acetabulum Active Problems:   Smoker   COPD (chronic obstructive pulmonary disease)   Acute encephalopathy   Recurrent falls   Essential hypertension   Dyslipidemia   Acute respiratory failure with hypoxia   Acute bronchitis   Aspiration pneumonia   Discharge Condition: Stable/Improved  Diet recommendation: Dysphagia 3 (Mechanical Soft) with Nectar think liquid  Filed Weights   06/21/14 2134 06/24/14 1100  Weight: 65.3 kg (143 lb 15.4 oz) 65.3 kg (143 lb 15.4 oz)    History of present illness:  Henry Cruz is a 80 y.o. male with Past medical history of hypertension, dyslipidemia, CVA. The patient is presenting with complaints of left hip pain that started 2 days ago after his recent fall. The fall was not witnessed but patient was alone and sitting and was eating and had an episode of vomiting and he slipped on the vomitus and fell on the floor. He did not hit his head or neck. But he started having complaints of pain on his left hip and was unable to move around and therefore he saw his sport physician who referred him to ER for further workup. Patient has been having on and off episodes of confusion as per patient's wife. Also on the day of the fall the patient has been having episodes of vomiting as well as multiple episodes of diarrhea. No recent change in his medications reported. Patient has been coughing at his baseline and has rattling in his chest at his baseline. Patient is an active smoker. Patient was admitted at [facility] after a fall leading to skull fracture and  has been having difficulty with balance since then and is working with physical therapy.  Hospital Course:  Patient is a pleasant 80 year old gentleman with a past medical history of dyslipidemia, hypertension, CVA admitted to the medicine service on 06/21/2014 presenting with left hip pain. He had an unwitnessed fall at home, having episode of nausea/vomiting then slipping on his emesis. Plain films revealed moderate symmetric narrowing of both hip joints no fracture or dislocation appreciated per radiology. This was followed up with a CT scan a left hip which showed comminuted fracture in the left acetabulum without significant displacement. Orthopedic surgery consulted who recommending non-operative management. Patient's hospitalization was complicated by the development of delirium likely precipitated by narcotic analgesics, hospitalization, pain and underlying infectious process. CXR showed developing bibasilar airspace disease suspicious for infection or aspiration. He was started on Zosyn and SLP was consulted. He was found to have moderate pharyngeal phase dysphagia and  recommended a dysphasia 3 nectar thick liquid. Repeat CXR remained stable. He was discharged to SNF on 06/25/2014 in stable condition.    Consultations:  Orthopedic Surgery  SLP  PT  Discharge Exam: Filed Vitals:   06/25/14 0538  BP: 129/77  Pulse: 65  Temp: 98.4 F (36.9 C)  Resp:      General: Patient is less confused, looks a lot better, out of bed to chair  Cardiovascular: regular rate and rhythm normal S1-S2  Respiratory: on supplemental oxygen, decreased crackles and rhonchi  Abdomen: soft nontender nondistended  Musculoskeletal: patient having pain withpassive and  active movement of left hip  Discharge Instructions You were cared for by a hospitalist during your hospital stay. If you have any questions about your discharge medications or the care you received while you were in the hospital after  you are discharged, you can call the unit and asked to speak with the hospitalist on call if the hospitalist that took care of you is not available. Once you are discharged, your primary care physician will handle any further medical issues. Please note that NO REFILLS for any discharge medications will be authorized once you are discharged, as it is imperative that you return to your primary care physician (or establish a relationship with a primary care physician if you do not have one) for your aftercare needs so that they can reassess your need for medications and monitor your lab values.  Discharge Instructions    Call MD for:  difficulty breathing, headache or visual disturbances    Complete by:  As directed      Call MD for:  extreme fatigue    Complete by:  As directed      Call MD for:  hives    Complete by:  As directed      Call MD for:  persistant dizziness or light-headedness    Complete by:  As directed      Call MD for:  persistant nausea and vomiting    Complete by:  As directed      Call MD for:  redness, tenderness, or signs of infection (pain, swelling, redness, odor or green/yellow discharge around incision site)    Complete by:  As directed      Call MD for:  severe uncontrolled pain    Complete by:  As directed      Call MD for:  temperature >100.4    Complete by:  As directed      Diet - low sodium heart healthy    Complete by:  As directed      Increase activity slowly    Complete by:  As directed           Current Discharge Medication List    START taking these medications   Details  amoxicillin-clavulanate (AUGMENTIN) 875-125 MG per tablet Take 1 tablet by mouth 2 (two) times daily. Qty: 10 tablet, Refills: 1    budesonide-formoterol (SYMBICORT) 160-4.5 MCG/ACT inhaler Inhale 2 puffs into the lungs 2 (two) times daily. Qty: 1 Inhaler, Refills: 12    Maltodextrin-Xanthan Gum (RESOURCE THICKENUP CLEAR) POWD Take 5 g by mouth as needed. Qty: 10 Can,  Refills: 1    senna (SENOKOT) 8.6 MG TABS tablet Take 1 tablet (8.6 mg total) by mouth 2 (two) times daily. Qty: 120 each, Refills: 0      CONTINUE these medications which have NOT CHANGED   Details  Acetaminophen (PAIN RELIEVER EXTRA STRENGTH PO) Take 2 tablets by mouth daily.    amLODipine (NORVASC) 5 MG tablet Take 5 mg by mouth daily.    aspirin 81 MG tablet Take 81 mg by mouth daily.    buPROPion (WELLBUTRIN XL) 150 MG 24 hr tablet Take 150 mg by mouth 2 (two) times daily.    lisinopril (PRINIVIL,ZESTRIL) 20 MG tablet Take 20 mg by mouth daily.    rosuvastatin (CRESTOR) 10 MG tablet Take 10 mg by mouth daily.      STOP taking these medications     diclofenac sodium (VOLTAREN) 1 % GEL      ibuprofen (ADVIL,MOTRIN) 200  MG tablet      Multiple Vitamins-Minerals (CENTRUM SILVER PO)        Allergies  Allergen Reactions  . Erythromycin Hives  . Vicodin [Hydrocodone-Acetaminophen] Other (See Comments)    See things   Follow-up Information    Follow up with MURPHY, TIMOTHY, D, MD In 2 weeks.   Specialty:  Orthopedic Surgery   Contact information:   [address] [place] [address] [phone]        The results of significant diagnostics from this hospitalization (including imaging, microbiology, ancillary and laboratory) are listed below for reference.    Significant Diagnostic Studies: Dg Chest 2 View  06/25/2014   CLINICAL DATA:  Community acquired pneumonia.  Aspiration pneumonia.  EXAM: CHEST  2 VIEW  COMPARISON:  06/23/2014.  FINDINGS: Cardiac silhouette is normal in size. No mediastinal or hilar masses.  Left lung base opacity obscures posterior left hemidiaphragm. This is consistent with pneumonia in the proper clinical setting. It could reflect atelectasis. Milder right basilar activity is more likely atelectasis the could reflect additional pneumonia.  No pulmonary edema. No convincing pleural effusion. No pneumothorax.  Bony thorax is  demineralized but grossly intact.  IMPRESSION: 1. Persistent left lower lobe opacity consistent with pneumonia in the proper clinical setting. Milder right basilar opacity is more likely atelectasis. This is also stable.   Electronically Signed   By: Lajean Manes M.D.   On: 06/25/2014 09:45   Dg Hip Complete Left  06/21/2014   ADDENDUM REPORT: 06/21/2014 16:26  ADDENDUM: There is calcification in the right mid abdomen, due to peripherally calcified fluid collection arising adjacent to the right kidney as noted on CT examination May 24, 2014. There is an aortic stent graft present.   Electronically Signed   By: Lowella Grip M.D.   On: 06/21/2014 16:26   06/21/2014   CLINICAL DATA:  Patient fell twice in past 2 days with pain  EXAM: LEFT HIP - COMPLETE 2+ VIEW  COMPARISON:  None.  FINDINGS: Frontal pelvis as well as frontal and lateral left hip images were obtained. There is moderate symmetric narrowing of both hip joints. There is no appreciable fracture or dislocation. No erosive change. There is postoperative change in each inguinal region.  IMPRESSION: Moderate symmetric narrowing of both hip joints. No fracture or dislocation is appreciable on this study.  Electronically Signed: By: Lowella Grip M.D. On: 06/21/2014 16:18   Ct Head Wo Contrast  06/22/2014   CLINICAL DATA:  Patient fell in hospital room, initial evaluation,  EXAM: CT HEAD WITHOUT CONTRAST  TECHNIQUE: Contiguous axial images were obtained from the base of the skull through the vertex without intravenous contrast.  COMPARISON:  August 13, 2011  FINDINGS: No hemorrhage or extra-axial fluid. Diffuse atrophy and low attenuation in the deep white matter progressive when compared the prior study. Multiple chronic small lacunar infarcts in the deep white matter bilaterally. No skull fracture.  IMPRESSION: No acute traumatic injury.  Severe chronic involutional change.   Electronically Signed   By: Skipper Cliche M.D.   On:  06/22/2014 00:02   Ct Angio Chest Pe W/cm &/or Wo Cm  06/21/2014   CLINICAL DATA:  Fall with acetabular fracture 2 days ago. Shortness of breath. Hypertension.  EXAM: CT ANGIOGRAPHY CHEST WITH CONTRAST  TECHNIQUE: Multidetector CT imaging of the chest was performed using the standard protocol during bolus administration of intravenous contrast. Multiplanar CT image reconstructions and MIPs were obtained to evaluate the vascular anatomy.  CONTRAST:  158mL OMNIPAQUE IOHEXOL 350 MG/ML SOLN  COMPARISON:  Radiograph of earlier today.  No prior CT.  FINDINGS: Lungs/Pleura: Mild degradation secondary to patient arm position, not raised above the head. There is also mild motion degradation throughout. This is most severe at the lung bases. Advanced centrilobular emphysema.  Right base atelectasis. No correlate for the right upper lobe opacity questioned on plain film within the lungs.  Trace left pleural fluid.  Heart/Mediastinum: The quality of this examination for evaluation of pulmonary embolism is good. Limitations are detailed above. The bolus is well timed. No evidence of pulmonary embolism to the large segmental level.  Pulmonary artery enlargement, with the outflow tract measuring 3.9 cm.  Aortic and branch vessel atherosclerosis. Tortuous thoracic aorta. Mild ectasia of the ascending segment 4.1 cm. Tortuous descending segment. Moderate cardiomegaly with coronary artery atherosclerosis. No mediastinal or hilar adenopathy. Small hiatal hernia.  Upper Abdomen: Multiple hepatic cysts. Normal spleen, distal stomach, pancreas, gallbladder, biliary tract, adrenal glands. Bilateral renal lesions which were better characterized on the recent dedicated CT. No free intraperitoneal air or imaged fluid. Incompletely imaged abdominal aortic endograft repair.  Bones/Musculoskeletal: Posterior high right rib fractures are remote. There also anterior high right rib fractures which are remote.  Review of the MIP images confirms  the above findings.  IMPRESSION: 1. Degradation secondary to patient arm position and respiratory motion. 2. No evidence of pulmonary embolism to the large segmental level. 3. Centrilobular emphysema. 4. Pulmonary artery enlargement suggests pulmonary arterial hypertension. 5. Incompletely imaged aortic endograft repair. 6. Mild ectasia of the ascending aorta.   Electronically Signed   By: Abigail Miyamoto M.D.   On: 06/21/2014 21:03   Ct Hip Left Wo Contrast  06/21/2014   CLINICAL DATA:  [name] out of bed two days previous with left hip pain  EXAM: CT OF THE LEFT HIP WITHOUT CONTRAST  TECHNIQUE: Multidetector CT imaging of the left hip was performed according to the standard protocol. Multiplanar CT image reconstructions were also generated.  COMPARISON:  Plain film from earlier in the same day  FINDINGS: Generalized osteopenia is noted. Small left hip joint effusion is noted. The proximal left femur is well seated and within normal limits without fracture. There is however evidence of a comminuted fracture which involves the acetabulum and extends posteriorly, superiorly and anteriorly. It also extends medially to involve the junction with the superior pubic ramus on the left. No significant displacement of the fracture fragments is noted. No other focal abnormality is seen.  IMPRESSION: Comminuted fracture in the left acetabulum without significant displacement.  These results will be called to the ordering clinician or representative by the Radiologist Assistant, and communication documented in the PACS or zVision Dashboard.   Electronically Signed   By: Inez Catalina M.D.   On: 06/21/2014 17:09   Dg Chest 1v Repeat Same Day  06/23/2014   CLINICAL DATA:  Rule out pneumonia. Cough and congestion since this morning.  EXAM: CHEST - 1 VIEW SAME DAY  COMPARISON:  CT of 06/21/2014 and plain film of 06/21/2014  FINDINGS: Midline trachea. moderate cardiomegaly. Possible small left pleural effusion. No pneumothorax.  Worsened bibasilar aeration with increased left greater than right airspace disease.  IMPRESSION: Developing bibasilar airspace disease. Suspicious for infection or aspiration, especially on the left.  Possible small left pleural effusion.   Electronically Signed   By: Abigail Miyamoto M.D.   On: 06/23/2014 14:42   Dg Chest Port 1 View  06/21/2014   CLINICAL  DATAG[place] June 19, 2014, productive cough for 1 week, image evaluation 03/07/2014  EXAM: PORTABLE CHEST - 1 VIEW  COMPARISON:  None.  FINDINGS: The heart size and vascular pattern are normal. Aortic calcifications stable. Left lung is clear except for mono lower lobe scarring or atelectasis. On the right, there is also mild scarring or atelectasis in the lower lobe which is similar to the prior study  There is an approximately 2 cm rounded opacity projecting over the right upper lobe. It is possible that this is related to the anterior and of the second rib but a pulmonary nodule is not excluded.  There is a mildly to moderately displaced fracture of the posterior left sixth rib. This is not seen on the prior study.  IMPRESSION: Recommend CT thorax to evaluate for possible pulmonary nodule.  Left sixth rib fracture of uncertain age, but apparently new from March 07, 2014.   Electronically Signed   By: Skipper Cliche M.D.   On: 06/21/2014 19:10   Dg Swallowing Func-speech Pathology  06/24/2014   Eden Emms, Roberta     06/24/2014  3:12 PM Objective Swallowing Evaluation: Modified Barium Swallowing Study   Patient Details  Name: Henry Cruz MRN: [id number] Date of Birth: 03-09-1925  Today's Date: 06/24/2014 Time: 1401-1430 SLP Time Calculation (min) (ACUTE ONLY): 29 min  Past Medical History:  Past Medical History  Diagnosis Date  . Hypertension   . Hyperlipidemia   . Stroke   . Cancer    Past Surgical History:  Past Surgical History  Procedure Laterality Date  . Hernia repair    . Aorta surgery     HPI:  Patient is a pleasant 80 year old  gentleman with a past medical  history of dyslipidemia, hypertension, CVA admitted to the  medicine service on 06/21/2014 presenting with left hip pain. He  had an unwitnessed fall at home. Plain films revealed moderate  symmetric narrowing of both hip joints no fracture or dislocation  appreciated per radiology. This was followed up with a CT scan a  left hip which showed comminuted fracture in the left acetabulum  without significant displacement. concern for pna, CT shows  developing bibasilar airspace disease, Suspicious for infection  or     Assessment / Plan / Recommendation Clinical Impression  Dysphagia Diagnosis: Moderate pharyngeal phase dysphagia  Pt demonstrates an age related  pharyngeal dysphagia likely  exacerbated by decreased respiratory status leading to decreased  apneic period and delayed initiation of swallow. He consistently  silently aspirates when given trials of thin liquids. SLP  provided cueing for chin tuck which was not effective in  preventing penetration/aspiration with thin liquids, but was  effective in preventing penetration/aspiration with trials of  nectar thick liquids. Recommend dysphagia 3 (mechanical soft)  diet and nectar thick liquids with chin tuck. SLP will follow up  with compensatory strategy training, family education regarding  liquid thickening, and diet advancement as appropriate.    Treatment Recommendation  Therapy as outlined in treatment plan below    Diet Recommendation Dysphagia 3 (Mechanical Soft);Nectar-thick  liquid   Liquid Administration via: Cup;Straw Medication Administration: Whole meds with puree Supervision: Full supervision/cueing for compensatory strategies Compensations: Slow rate;Small sips/bites Postural Changes and/or Swallow Maneuvers: Chin tuck;Seated  upright 90 degrees    Other  Recommendations Oral Care Recommendations: Oral care BID   Follow Up Recommendations  Skilled Nursing facility    Frequency and Duration min 2x/week  2 weeks    Pertinent Vitals/Pain none  SLP Swallow Goals     General Date of Onset: 06/21/14 HPI: Patient is a pleasant 80 year old gentleman with a past  medical history of dyslipidemia, hypertension, CVA admitted to  the medicine service on 06/21/2014 presenting with left hip pain.  He had an unwitnessed fall at home. Plain films revealed moderate  symmetric narrowing of both hip joints no fracture or dislocation  appreciated per radiology. This was followed up with a CT scan a  left hip which showed comminuted fracture in the left acetabulum  without significant displacement. concern for pna, CT shows  developing bibasilar airspace disease, Suspicious for infection  or Type of Study: Modified Barium Swallowing Study Reason for Referral: Objectively evaluate swallowing function Previous Swallow Assessment: no Diet Prior to this Study: NPO Temperature Spikes Noted: No Respiratory Status: Nasal cannula History of Recent Intubation: No Behavior/Cognition: Alert;Cooperative;Pleasant mood Oral Cavity - Dentition: Dentures, top;Dentures, bottom Oral Motor / Sensory Function: Within functional limits Self-Feeding Abilities: Able to feed self Patient Positioning: Upright in chair Baseline Vocal Quality: Clear Volitional Cough: Strong;Congested Volitional Swallow: Able to elicit Anatomy: Within functional limits    Reason for Referral Objectively evaluate swallowing function   Oral Phase Oral Preparation/Oral Phase Oral Phase: WFL Oral - Nectar Oral - Nectar Cup: Within functional limits Oral - Nectar Straw: Within functional limits Oral - Thin Oral - Thin Cup: Within functional limits Oral - Solids Oral - Puree: Within functional limits Oral - Regular: Within functional limits Oral - Pill: Within functional limits   Pharyngeal Phase Pharyngeal Phase Pharyngeal Phase: Impaired Pharyngeal - Honey Pharyngeal - Honey Cup: Delayed swallow initiation;Premature  spillage to valleculae Pharyngeal - Nectar Pharyngeal - Nectar Cup: Delayed  swallow initiation;Premature  spillage to pyriform sinuses;Penetration/Aspiration during  swallow;Moderate aspiration;Compensatory strategies attempted  (Comment) (chin tuck, effortful swallow) Penetration/Aspiration details (nectar cup): Material enters  airway, remains ABOVE vocal cords and not ejected out Pharyngeal - Nectar Straw: Delayed swallow initiation;Premature  spillage to pyriform sinuses;Penetration/Aspiration during  swallow;Moderate aspiration;Compensatory strategies attempted  (Comment) Penetration/Aspiration details (nectar straw): Material enters  airway, passes BELOW cords without attempt by patient to eject  out (silent aspiration) Pharyngeal - Thin Pharyngeal - Thin Cup: Delayed swallow initiation;Premature  spillage to pyriform sinuses;Penetration/Aspiration during  swallow;Compensatory strategies attempted (Comment) Penetration/Aspiration details (thin cup): Material enters  airway, remains ABOVE vocal cords and not ejected out Pharyngeal - Solids Pharyngeal - Puree: Within functional limits Pharyngeal - Regular: Within functional limits Pharyngeal - Pill: Delayed swallow initiation;Premature spillage  to pyriform sinuses;Penetration/Aspiration before swallow  Cervical Esophageal Phase    GO    Cervical Esophageal Phase Cervical Esophageal Phase: Delfina Redwood 06/24/2014, 3:05 PM     Microbiology: No results found for this or any previous visit (from the past 240 hour(s)).   Labs: Basic Metabolic Panel:  Recent Labs Lab 06/21/14 1835 06/22/14 0639 06/23/14 0600 06/24/14 0454 06/25/14 0521  NA 144 142 138 143 144  K 4.2 4.2 3.8 4.0 4.1  CL 108 108 105 108 109  CO2 21 16* 21 22 21   GLUCOSE 112* 110* 111* 112* 109*  BUN 28* 26* 24* 25* 24*  CREATININE 1.21 1.08 1.12 1.15 1.24  CALCIUM 9.0 8.8 9.0 9.0 9.1   Liver Function Tests:  Recent Labs Lab 06/22/14 0639  AST 14  ALT 10  ALKPHOS 67  BILITOT 0.4  PROT 6.2  ALBUMIN 3.0*   No results for  input(s): LIPASE, AMYLASE in the last 168 hours.  Recent Labs Lab 06/21/14 2319  AMMONIA 10*   CBC:  Recent Labs Lab 06/21/14 1835 06/22/14 0639 06/23/14 0600 06/24/14 0454 06/25/14 0521  WBC 8.2 8.5 7.7 7.9 7.6  NEUTROABS 7.0 7.3  --   --   --   HGB 11.4* 11.0* 11.0* 10.8* 10.6*  HCT 35.5* 33.4* 33.6* 32.8* 33.6*  MCV 93.7 95.7 95.7 95.3 94.4  PLT 213 200 210 248 269   Cardiac Enzymes: No results for input(s): CKTOTAL, CKMB, CKMBINDEX, TROPONINI in the last 168 hours. BNP: BNP (last 3 results) No results for input(s): PROBNP in the last 8760 hours. CBG: No results for input(s): GLUCAP in the last 168 hours.     SignedKelvin Cellar  [facility] 06/25/2014, 10:43 AM

## 2014-06-25 NOTE — Progress Notes (Signed)
Speech Language Pathology Treatment: Dysphagia  Patient Details Name: Henry Cruz MRN: [id number] DOB: 08-17-1924 Today's Date: 06/25/2014 Time: 1021-1050 SLP Time Calculation (min) (ACUTE ONLY): 29 min  Assessment / Plan / Recommendation Clinical Impression  Pt is tolerating dysphagia 3 (mechanical soft) with nectar thick liquids. Minimal verbal cues were provided for chin tuck with sips of nectar, pt independently using strategy by the end of session. SLP provided education regarding use of thicker to pt and his wife and provided reinforcement regarding use of strategy to pt. Recommend pt continue dysphagia 2 (mechanical soft) diet and nectar thick liquids with chin tuck. SLP will continue to follow.    HPI HPI: Patient is a pleasant 80 year old gentleman with a past medical history of dyslipidemia, hypertension, CVA admitted to the medicine service on 06/21/2014 presenting with left hip pain. He had an unwitnessed fall at home. Plain films revealed moderate symmetric narrowing of both hip joints no fracture or dislocation appreciated per radiology. This was followed up with a CT scan a left hip which showed comminuted fracture in the left acetabulum without significant displacement. concern for pna, CT shows developing bibasilar airspace disease, Suspicious for infection or   Pertinent Vitals Pain Assessment: No/denies pain Pain Score: 0-No pain Pain Intervention(s): Monitored during session  SLP Plan  Continue with current plan of care    Recommendations Diet recommendations: Dysphagia 3 (mechanical soft);Nectar-thick liquid Liquids provided via: Cup;Straw Medication Administration: Whole meds with puree Supervision: Patient able to self feed;Full supervision/cueing for compensatory strategies Compensations: Slow rate;Small sips/bites Postural Changes and/or Swallow Maneuvers: Chin tuck;Seated upright 90 degrees              Oral Care Recommendations: Oral care BID Follow up  Recommendations: Skilled Nursing facility Plan: Continue with current plan of care    GO     Eden Emms 06/25/2014, 1:27 PM

## 2014-06-25 NOTE — Progress Notes (Signed)
Physical Therapy Treatment Patient Details Name: Henry Cruz MRN: [id number] DOB: [birth year] Today's Date: 06/25/2014    History of Present Illness Fracture of L acetabulum; Pneumonia.    PT Comments    Session focused on functional mobility training to maximize independence and safety with mobility, while maintaining TDWB precautions. Patient sat edge of bed and transferred to the chair, which helped facilitate airway clearance and improve respiratory function. Patient demonstrated increased strength compared to yesterday's session as noted through decreased assistance required during bed mobility and transfer.   Continue to recommend SNF for post-acute therapy services.      Follow Up Recommendations  SNF     Equipment Recommendations  Rolling walker with 5" wheels;3in1 (PT);Wheelchair (measurements PT);Wheelchair cushion (measurements PT)    Recommendations for Other Services       Precautions / Restrictions Precautions Precautions: Fall; Aspiration/Dysphagia Precaution Booklet Issued: No Restrictions Weight Bearing Restrictions: Yes Other Position/Activity Restrictions: TTWB on LLE    Mobility  Bed Mobility Overal bed mobility: Needs Assistance;+2 for physical assistance Bed Mobility: Supine to Sit     Supine to sit: +2 for physical assistance;Mod assist     General bed mobility comments: Patient was able to manage L LE independently during bed mobility. Patient was able to bear weight through bilateral UE and R LE with improved strength from yesterday's session.  Transfers Overall transfer level: Needs assistance Equipment used: Rolling walker (2 wheeled) Transfers: Squat Pivot Transfers Sit to Stand: Mod assist   Squat pivot transfers: Mod assist     General transfer comment: Cues for UE placement and to push through R LE. Patient demonstrated recall of L LE WB precautions. Patient had difficulty attending to verbal cues for hand placement when  completing stand to sit from the rolling walker.  Ambulation/Gait                 Stairs            Wheelchair Mobility    Modified Rankin (Stroke Patients Only)       Balance Overall balance assessment: Needs assistance Sitting-balance support: Feet unsupported;Bilateral upper extremity supported Sitting balance-Leahy Scale: Fair     Standing balance support: Bilateral upper extremity supported                        Cognition Arousal/Alertness: Awake/alert Behavior During Therapy: WFL for tasks assessed/performed Overall Cognitive Status: Within Functional Limits for tasks assessed                      Exercises      General Comments General comments (skin integrity, edema, etc.): Pt was on supplemental O2. O2 sat was observed at a low of 90% following transfer. Sat was 92% at end of session.      Pertinent Vitals/Pain Pain Assessment: No/denies pain Pain Intervention(s): Monitored during session    Home Living                      Prior Function            PT Goals (current goals can now be found in the care plan section) Acute Rehab PT Goals Patient Stated Goal: Agreeable to OOB PT Goal Formulation: With patient/family Time For Goal Achievement: 07/07/14 Potential to Achieve Goals: Good    Frequency  Min 3X/week    PT Plan  Current plan remains appropriate.    Co-evaluation  End of Session Equipment Utilized During Treatment: Gait belt;Oxygen Activity Tolerance: Patient tolerated treatment well Patient left: in chair;with call bell/phone within reach;with chair alarm set;with family/visitor present     Time: 1010-1032 PT Time Calculation (min) (ACUTE ONLY): 22 min  Charges:                       G CodesShawna Orleans, SPT Acute Rehabilitation Services [phone]  Shawna Orleans 06/25/2014, 10:58 AM

## 2014-06-25 NOTE — Plan of Care (Signed)
Problem: SLP Dysphagia Goals Goal: Patient will utilize recommended strategies Patient will utilize recommended strategies during swallow to increase swallowing safety with  Outcome: Progressing     

## 2014-06-25 NOTE — Plan of Care (Signed)
Problem: Phase I Progression Outcomes Goal: Initial discharge plan identified Outcome: Completed/Met Date Met:  06/25/14 Goal: Voiding-avoid urinary catheter unless indicated Outcome: Completed/Met Date Met:  06/25/14 Goal: Hemodynamically stable Outcome: Completed/Met Date Met:  06/25/14 Goal: Other Phase I Outcomes/Goals Outcome: Not Applicable Date Met:  70/17/79  Problem: Phase II Progression Outcomes Goal: Progress activity as tolerated unless otherwise ordered Outcome: Completed/Met Date Met:  06/25/14 Goal: Discharge plan established Outcome: Completed/Met Date Met:  06/25/14 Goal: Vital signs remain stable Outcome: Completed/Met Date Met:  06/25/14 Goal: Obtain order to discontinue catheter if appropriate Outcome: Not Applicable Date Met:  39/03/00 Goal: Other Phase II Outcomes/Goals Outcome: Not Applicable Date Met:  92/33/00  Problem: Phase III Progression Outcomes Goal: Pain controlled on oral analgesia Outcome: Completed/Met Date Met:  06/25/14 Goal: Activity at appropriate level-compared to baseline (UP IN CHAIR FOR HEMODIALYSIS)  Outcome: Completed/Met Date Met:  06/25/14 Goal: Voiding independently Outcome: Not Applicable Date Met:  76/22/63 Goal: Foley discontinued Outcome: Completed/Met Date Met:  06/25/14

## 2014-09-03 ENCOUNTER — Ambulatory Visit: Payer: Self-pay | Admitting: Neurology

## 2014-09-05 ENCOUNTER — Ambulatory Visit (INDEPENDENT_AMBULATORY_CARE_PROVIDER_SITE_OTHER): Payer: Medicare HMO | Admitting: Neurology

## 2014-09-05 ENCOUNTER — Encounter: Payer: Self-pay | Admitting: Neurology

## 2014-09-05 VITALS — BP 142/70 | HR 68 | Resp 14 | Ht 66.0 in | Wt 141.0 lb

## 2014-09-05 DIAGNOSIS — R296 Repeated falls: Secondary | ICD-10-CM

## 2014-09-05 DIAGNOSIS — R292 Abnormal reflex: Secondary | ICD-10-CM

## 2014-09-05 DIAGNOSIS — R269 Unspecified abnormalities of gait and mobility: Secondary | ICD-10-CM | POA: Diagnosis not present

## 2014-09-05 DIAGNOSIS — G8190 Hemiplegia, unspecified affecting unspecified side: Secondary | ICD-10-CM

## 2014-09-05 DIAGNOSIS — I6990 Unspecified sequelae of unspecified cerebrovascular disease: Secondary | ICD-10-CM | POA: Diagnosis not present

## 2014-09-05 DIAGNOSIS — G8191 Hemiplegia, unspecified affecting right dominant side: Secondary | ICD-10-CM | POA: Diagnosis not present

## 2014-09-05 NOTE — Progress Notes (Signed)
[facility]  PATIENT: Henry Cruz DOB: 07-May-1925  REFERRING CLINICIAN: Blenda Mounts HISTORY FROM: Patient and wife REASON FOR VISIT: History of stroke, frequent falling, tremors.   HISTORICAL  CHIEF COMPLAINT:  Chief Complaint  Patient presents with  . Tremors    Hx. of 2 strokes.  Denies new stroke sx.  Sts. tremor is about the same, left hand worse than right.  Gait/balance is some worse--2 falls since last ov.    . Cerebrovascular Accident    HISTORY OF PRESENT ILLNESS:  I had the pleasure seeing your patient, Henry Cruz, at [facility] for a consultation regarding his recent history of multiple falls one of which led to an orbital skull fracture. He had a fall abpiut a month ago landing on his left face with subsequent bruising around the left eye.   He fell his chair to get into his wheelchair. A maxillofacial CT scan on 08/19/2014 showed a minimally displaced fracture within the left posterior and lateral maxillary sinus and a minimally displaced left lateral orbital wall fracture with a small bone fragment contacting the lateral rectus muscle. Additionally there was a comminuted fracture of the left zygomatic arch and soft tissue swelling over the left face. He has seen ophthalmology and they do not feel that he needed any surgical intervention.  He had another fall about 2 weeks ago landing on the right face. He required stitches but there were no broken bones with this fall.  He had a right middle cerebral artery stroke in 2006. He had weakness on the left side but he improved quite a bit did not need to use a cane after he recovered. He did not have any significant numbness after the stroke. Although his gait improved quite a bit he has had quite a few falls since then including a severe fall in 2013 when he broke his hip and the more recent ones described above.    He seems to have multiple contributors to his falling.   At times,  he will get up too quickly and trip right away. Once he vomited and then tripped on the vomited. His wife does not note that he veers one way or another when he walks, though I would be concerned about a neglect with his stroke in the past.   If he stands up quickly, he sometimes gets a little lightheaded but has never had pre-syncope.       He denies any neck or back pain.  No pain in arms or legs.  No change in bladder.    He feels memory is fine but wife notes he makes more errors lately.     REVIEW OF SYSTEMS:  Constitutional: No fevers, chills, sweats, or change in appetite Eyes: No visual changes, double vision, eye pain Ear, nose and throat: No hearing loss, ear pain, nasal congestion, sore throat Cardiovascular: No chest pain, palpitations Respiratory:  No shortness of breath at rest or with exertion.   No wheezes GastrointestinaI: No nausea, vomiting, diarrhea, abdominal pain, fecal incontinence Genitourinary:  No dysuria, urinary retention or frequency.  No nocturia. Musculoskeletal:  No neck pain, back pain Integumentary: No rash, pruritus, skin lesions Neurological: as above Psychiatric: No depression at this time.  No anxiety Endocrine: No palpitations, diaphoresis, change in appetite, change in weigh or increased thirst Hematologic/Lymphatic:  No anemia, purpura, petechiae. Allergic/Immunologic: No itchy/runny eyes, nasal congestion, recent allergic reactions, rashes  ALLERGIES: Allergies  Allergen Reactions  . Azithromycin Hives  . Erythromycin  Hives  . Hydrocodone Other (See Comments)  . Pravastatin Sodium     Other reaction(s): Other (See Comments) Myalgias  . Vicodin [Hydrocodone-Acetaminophen] Other (See Comments)    See things    HOME MEDICATIONS: Outpatient Prescriptions Prior to Visit  Medication Sig Dispense Refill  . amLODipine (NORVASC) 5 MG tablet Take 5 mg by mouth daily.    Marland Kitchen amoxicillin-clavulanate (AUGMENTIN) 875-125 MG per tablet Take 1 tablet by  mouth 2 (two) times daily. 10 tablet 1  . aspirin 81 MG tablet Take 81 mg by mouth daily.    . budesonide-formoterol (SYMBICORT) 160-4.5 MCG/ACT inhaler Inhale 2 puffs into the lungs 2 (two) times daily. 1 Inhaler 12  . buPROPion (WELLBUTRIN XL) 150 MG 24 hr tablet Take 150 mg by mouth 2 (two) times daily.    . Maltodextrin-Xanthan Gum (RESOURCE THICKENUP CLEAR) POWD Take 5 g by mouth as needed. 10 Can 1  . rosuvastatin (CRESTOR) 10 MG tablet Take 10 mg by mouth daily.    Marland Kitchen senna (SENOKOT) 8.6 MG TABS tablet Take 1 tablet (8.6 mg total) by mouth 2 (two) times daily. 120 each 0  . Acetaminophen (PAIN RELIEVER EXTRA STRENGTH PO) Take 2 tablets by mouth daily.    Marland Kitchen lisinopril (PRINIVIL,ZESTRIL) 20 MG tablet Take 20 mg by mouth daily.     No facility-administered medications prior to visit.    PAST MEDICAL HISTORY: Past Medical History  Diagnosis Date  . Hypertension   . Hyperlipidemia   . Stroke   . Cancer   . Hearing loss   . Movement disorder   . Vision abnormalities     PAST SURGICAL HISTORY: Past Surgical History  Procedure Laterality Date  . Hernia repair    . Aorta surgery      FAMILY HISTORY: Family History  Problem Relation Age of Onset  . Heart attack Father   . Healthy Mother     SOCIAL HISTORY:  History   Social History  . Marital Status: Married    Spouse Name: N/A    Number of Children: N/A  . Years of Education: N/A   Occupational History  . Not on file.   Social History Main Topics  . Smoking status: Former Smoker -- 0.50 packs/day for 70 years    Types: Cigarettes  . Smokeless tobacco: Former Systems developer    Quit date: 05/09/2014  . Alcohol Use: No  . Drug Use: No  . Sexual Activity: Not on file   Other Topics Concern  . Not on file   Social History Narrative     PHYSICAL EXAM  Filed Vitals:   09/05/14 1415  BP: 142/70  Pulse: 68  Resp: 14  Height: 5\' 6"  (1.676 m)  Weight: 141 lb (63.957 kg)    Body mass index is 22.77  kg/(m^2).   General: The patient is well-developed and well-nourished and in no acute distress  Eyes:  Funduscopic exam shows normal optic discs and retinal vessels.  Neck: The neck is supple, no carotid bruits are noted.  The neck is nontender.  Respiratory: The respiratory examination is clear.  Cardiovascular: The cardiovascular examination reveals a regular rate and rhythm, no murmurs, gallops or rubs are noted.  Skin: Extremities are without significant edema.  Neurologic Exam  Mental status: The patient is alert and oriented x 3 at the time of the examination. He has poor attention ([place]) and reduced STM (1/3 without prompt; 2/3 with prompt)   Speech is normal.  Cranial nerves: Extraocular movements  are full. Pupils are equal, round, and reactive to light and accomodation.  Visual fields are full.  Facial symmetry is present. There is good facial sensation to soft touch bilaterally.Facial strength is normal.  Trapezius and sternocleidomastoid strength is normal. No dysarthria is noted.  The tongue is midline, and the patient has symmetric elevation of the soft palate. No obvious hearing deficits are noted.  Motor:  Muscle bulk is normal and tone is slightly increased in legs. Strength is  5 / 5 in all 4 extremities.   Sensory: Sensory testing is intact to pinprick, soft touch, vibration sensation, and position sense on all 4 extremities.   Normal vibratory sensation in feet  Coordination: Cerebellar testing reveals good finger-nose-finger and mildly reduced heel-to-shin bilaterally.  Gait and station: Station is normal and gait is mildly ataxic but with fairly good stride and he turns in 3 steps (normal at age). Romberg is borderline.   Reflexes: Deep tendon reflexes are increased in arms and legs with spread at the knees and 2 beats of clonus at ankles.  Plantar responses are normal.    DIAGNOSTIC DATA (LABS, IMAGING, TESTING) - I reviewed patient records, labs,  notes, testing and imaging myself where available.  Lab Results  Component Value Date   WBC 7.6 06/25/2014   HGB 10.6* 06/25/2014   HCT 33.6* 06/25/2014   MCV 94.4 06/25/2014   PLT 269 06/25/2014      Component Value Date/Time   NA 144 06/25/2014 0521   K 4.1 06/25/2014 0521   CL 109 06/25/2014 0521   CO2 21 06/25/2014 0521   GLUCOSE 109* 06/25/2014 0521   BUN 24* 06/25/2014 0521   CREATININE 1.24 06/25/2014 0521   CALCIUM 9.1 06/25/2014 0521   PROT 6.2 06/22/2014 0639   ALBUMIN 3.0* 06/22/2014 0639   AST 14 06/22/2014 0639   ALT 10 06/22/2014 0639   ALKPHOS 67 06/22/2014 0639   BILITOT 0.4 06/22/2014 0639   GFRNONAA 51* 06/25/2014 0521   GFRAA 59* 06/25/2014 0521   No results found for: CHOL, HDL, LDLCALC, LDLDIRECT, TRIG, CHOLHDL No results found for: HGBA1C No results found for: VITAMINB12 Lab Results  Component Value Date   TSH 3.530 06/21/2014      ASSESSMENT AND PLAN  Abnormality of gait - Plan: MR Cervical Spine Wo Contrast  Hyperreflexia - Plan: MR Cervical Spine Wo Contrast  Frequent falls  Hemiplegia affecting nondominant side  Cerebrovascular accident, late effects   In summary, Henry Cruz is an 81 yo man with a history of a right MCA stroke who has mad multiple falls in the past couple years, many associated with injuries.    On exam, he has hyperreflexia and gait ataxia with good strength and sensation in his legs.   I do not believe he has parkinsonism or neuropathy.   CT scan shows the old MCA stroke and widespread (chronic ischemic) white matter changes.   I believe his gait dysfunction is multifactorial due to the old stroke, arthritis and poor judgment (from minimal dementia).   I am concerned that there could also be a myelopathic component as he has hyperreflexia and we will obtain an MRI of the cervical spine (to be scheduled at [facility], closer to their home).   Unfortunately, if this is found, he is not a great surgical candidate.  If no  stenosis or myelopathy, the many ischemic changes in his brain are likely causing the ataxia.  I  have encouraged that he uses the walker in his  home as many of his falls might have been avoidable with its use.  We will call then with the results of the scan and follow up as needed.  He (or family) should call if new or worsening neurologic symptoms   Richard A. Felecia Shelling, MD, PhD 3/55/9741, 6:38 PM Certified in Neurology, Clinical Neurophysiology, Sleep Medicine, Pain Medicine and Neuroimaging  [facility] [address], [place], [place] [address] [phone]

## 2015-09-09 ENCOUNTER — Emergency Department (INDEPENDENT_AMBULATORY_CARE_PROVIDER_SITE_OTHER)
Admission: EM | Admit: 2015-09-09 | Discharge: 2015-09-09 | Disposition: A | Discharge status: Home / Self Care | Payer: Medicare HMO | Source: Home / Self Care | Attending: Family Medicine | Admitting: Family Medicine

## 2015-09-09 ENCOUNTER — Encounter: Payer: Self-pay | Admitting: *Deleted

## 2015-09-09 DIAGNOSIS — R1013 Epigastric pain: Secondary | ICD-10-CM | POA: Diagnosis not present

## 2015-09-09 DIAGNOSIS — R112 Nausea with vomiting, unspecified: Secondary | ICD-10-CM

## 2015-09-09 DIAGNOSIS — Z9889 Other specified postprocedural states: Secondary | ICD-10-CM

## 2015-09-09 DIAGNOSIS — Z8679 Personal history of other diseases of the circulatory system: Secondary | ICD-10-CM | POA: Diagnosis not present

## 2015-09-09 MED ORDER — ONDANSETRON 4 MG PO TBDP
4.0000 mg | ORAL_TABLET | Freq: Once | ORAL | Status: AC
  Administered 2015-09-09: 4 mg via ORAL

## 2015-09-09 NOTE — ED Provider Notes (Signed)
CSN: [id number]     Arrival date & time 09/09/15  1336 History   None    Chief Complaint  Patient presents with  . Abdominal Pain   (Consider location/radiation/quality/duration/timing/severity/associated sxs/prior Treatment) HPI Pt is an 82yo male brought to [facility] by his wife with concern for 2 weeks of gradually worsening epigastric pain with associated nausea and vomiting that started today.  Pt has hx of known aortic aneurysm with a repair from about 9 years ago.  Wife has been trying to call pt's doctor's at [facility] but was unable to get a hold of anyone.  His doctors have been trying to get him in for an ultrasound but it has been held up due to insurance reasons.  Abdominal pain is aching and sore, burning at times, mild to moderate in severity.  Pt vomited 3 times on the way to [facility]. No blood in emesis. Pt denies chest pain, back pain, or SOB. Denies fever or chills.   Past Medical History  Diagnosis Date  . Hypertension   . Hyperlipidemia   . Stroke   . Cancer   . Hearing loss   . Movement disorder   . Vision abnormalities    Past Surgical History  Procedure Laterality Date  . Hernia repair    . Aorta surgery     Family History  Problem Relation Age of Onset  . Heart attack Father   . Healthy Mother    Social History  Substance Use Topics  . Smoking status: Former Smoker -- 0.50 packs/day for 70 years    Types: Cigarettes  . Smokeless tobacco: Former Systems developer    Quit date: 05/09/2014  . Alcohol Use: No    Review of Systems  Constitutional: Negative for fever and chills.  Respiratory: Negative for cough and shortness of breath.   Cardiovascular: Negative for chest pain and palpitations.  Gastrointestinal: Positive for nausea, vomiting and abdominal pain. Negative for diarrhea and constipation.  Musculoskeletal: Negative for myalgias and back pain.    Allergies  Azithromycin; Erythromycin; Hydrocodone; Pravastatin sodium; and Vicodin  Home Medications    Prior to Admission medications   Medication Sig Start Date End Date Taking? Authorizing Provider  acetaminophen (TYLENOL) 500 MG tablet Take 500 mg by mouth.    Historical Provider, MD  albuterol (PROVENTIL HFA;VENTOLIN HFA) 108 (90 BASE) MCG/ACT inhaler Inhale into the lungs.    Historical Provider, MD  amLODipine (NORVASC) 5 MG tablet Take 5 mg by mouth daily.    Historical Provider, MD  amoxicillin-clavulanate (AUGMENTIN) 875-125 MG per tablet Take 1 tablet by mouth 2 (two) times daily. 06/25/14   Kelvin Cellar, MD  aspirin 81 MG tablet Take 81 mg by mouth daily.    Historical Provider, MD  budesonide-formoterol (SYMBICORT) 160-4.5 MCG/ACT inhaler Inhale 2 puffs into the lungs 2 (two) times daily. 06/25/14   Kelvin Cellar, MD  buPROPion (WELLBUTRIN XL) 150 MG 24 hr tablet Take 150 mg by mouth 2 (two) times daily.    Historical Provider, MD  clotrimazole-betamethasone (LOTRISONE) cream Apply topically.    Historical Provider, MD  [facility] NTS STEP 1 21 MG/24HR patch  07/19/14   Historical Provider, MD  haloperidol (HALDOL) 0.5 MG tablet  08/28/14   Historical Provider, MD  lisinopril (PRINIVIL,ZESTRIL) 2.5 MG tablet Take 2.5 mg by mouth daily.    Historical Provider, MD  Maltodextrin-Xanthan Gum (RESOURCE THICKENUP CLEAR) POWD Take 5 g by mouth as needed. 06/25/14   Kelvin Cellar, MD  meclizine (ANTIVERT) 25 MG  tablet  08/13/14   Historical Provider, MD  Multiple Vitamin (MULTI-VITAMINS) TABS Take by mouth.    Historical Provider, MD  rosuvastatin (CRESTOR) 10 MG tablet Take 10 mg by mouth daily.    Historical Provider, MD  senna (SENOKOT) 8.6 MG TABS tablet Take 1 tablet (8.6 mg total) by mouth 2 (two) times daily. 06/25/14   Kelvin Cellar, MD  vitamin B-12 (CYANOCOBALAMIN) 1000 MCG tablet Take by mouth.    Historical Provider, MD   Meds Ordered and Administered this Visit   Medications  ondansetron (ZOFRAN-ODT) disintegrating tablet 4 mg (not administered)    BP 148/71 mmHg  Pulse  79  Temp(Src) 98.4 F (36.9 C) (Oral)  Resp 16  SpO2 96% No data found.   Physical Exam  Constitutional: He appears well-developed and well-nourished.  HENT:  Head: Normocephalic and atraumatic.  Eyes: Conjunctivae are normal. No scleral icterus.  Neck: Normal range of motion. Neck supple.  Cardiovascular: Normal rate, regular rhythm and normal heart sounds.   Pulmonary/Chest: Effort normal. No respiratory distress. He has no wheezes. He has rhonchi ( diffuse). He has no rales. He exhibits no tenderness.  Abdominal: Soft. He exhibits no distension and no mass. There is tenderness. There is no rebound and no guarding.  Musculoskeletal: Normal range of motion.  Neurological: He is alert.  Skin: Skin is warm and dry.  Nursing note and vitals reviewed.   ED Course  Procedures (including critical care time)  Labs Review Labs Reviewed - No data to display  Imaging Review No results found.   MDM  No diagnosis found.  Pt with known aortic aneursym with repair c/o epigastric pain, nausea and vomiting. Pt appears to be doing well at this time. Alert, oriented, vitals: WNL. Moist mucous membranes.   However, due to pt's extensive medical history with concern for worsening aneurysm, recommend pt go to emergency department for further evaluation including labs and imaging.  No vomiting while pt at [facility].  Pt given 4mg  Zofran ODT.  Wife states she plans to take him to [facility] as that is where he had all of his surgeries.   Pt is in stable condition.  Wife feels comfortable driving pt, does not want EMS called.  Agree that pt is stable for transport to [facility] hospital.     Noland Fordyce, PA-C 09/09/15 1354

## 2015-09-09 NOTE — ED Notes (Signed)
Pt c/o epigastric pain x 2 wks, worse x today with vomiting. Reports hx of Abd aneurysm. His PCP is scheduling a scan to check his aneurysm.

## 2015-09-11 ENCOUNTER — Telehealth: Payer: Self-pay | Admitting: *Deleted

## 2016-03-06 IMAGING — CR DG HIP (WITH OR WITHOUT PELVIS) 2-3V*L*
4 series · 4 of 4 positions shown · non-contrast
Comparison: None.

ADDENDUM:
There is calcification in the right mid abdomen, due to peripherally
calcified fluid collection arising adjacent to the right kidney as
noted on CT examination May 24, 2014. There is an aortic stent
graft present.
CLINICAL DATA: Patient fell twice in past 2 days with pain

EXAM:
LEFT HIP - COMPLETE 2+ VIEW

[view not recorded (1 of 4)]
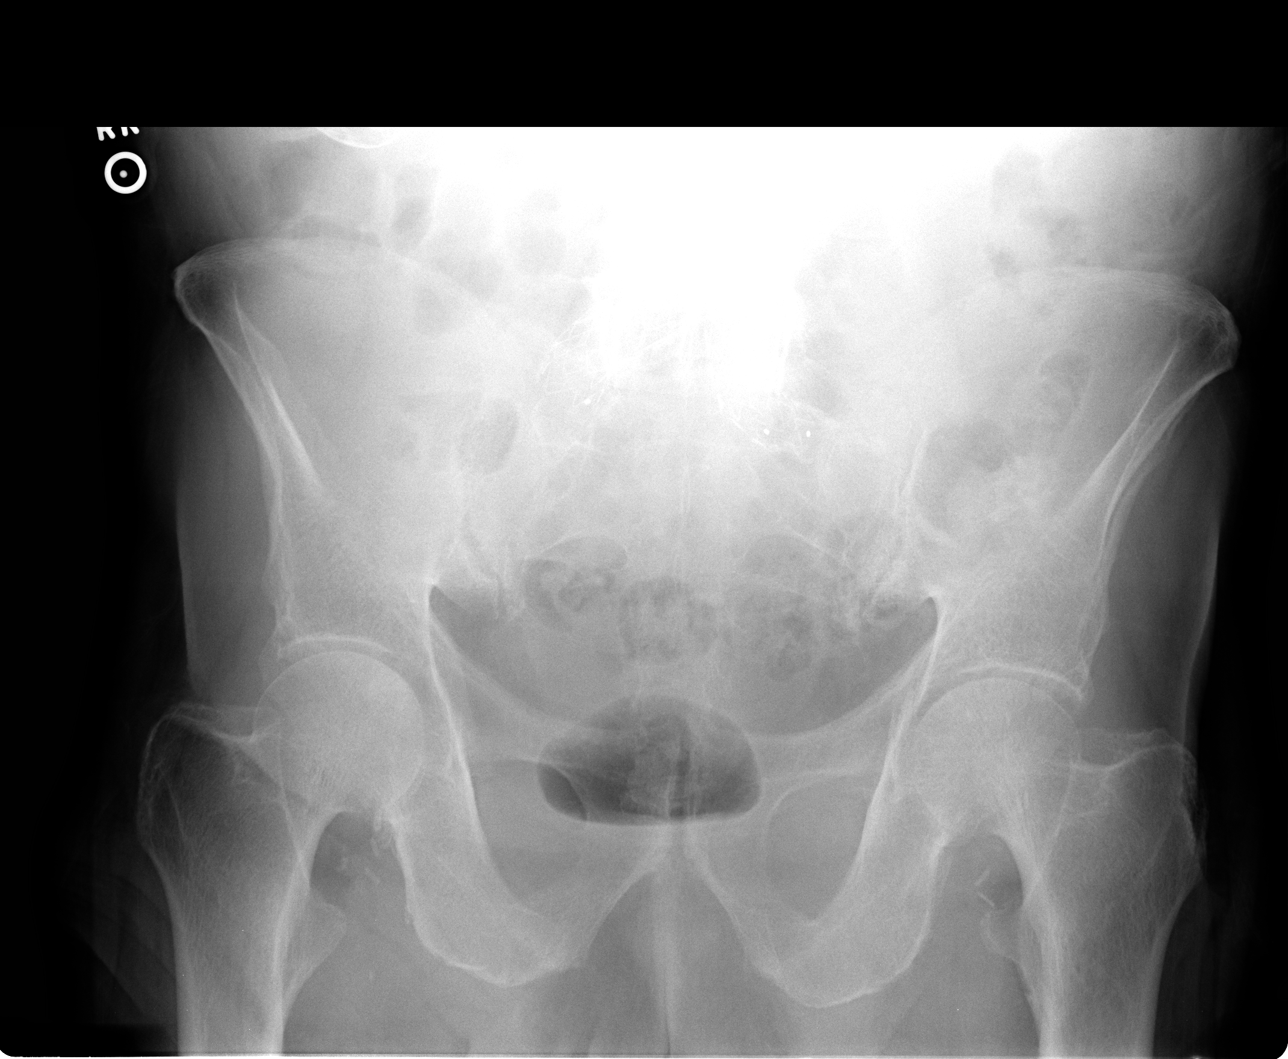

[view not recorded (2 of 4)]
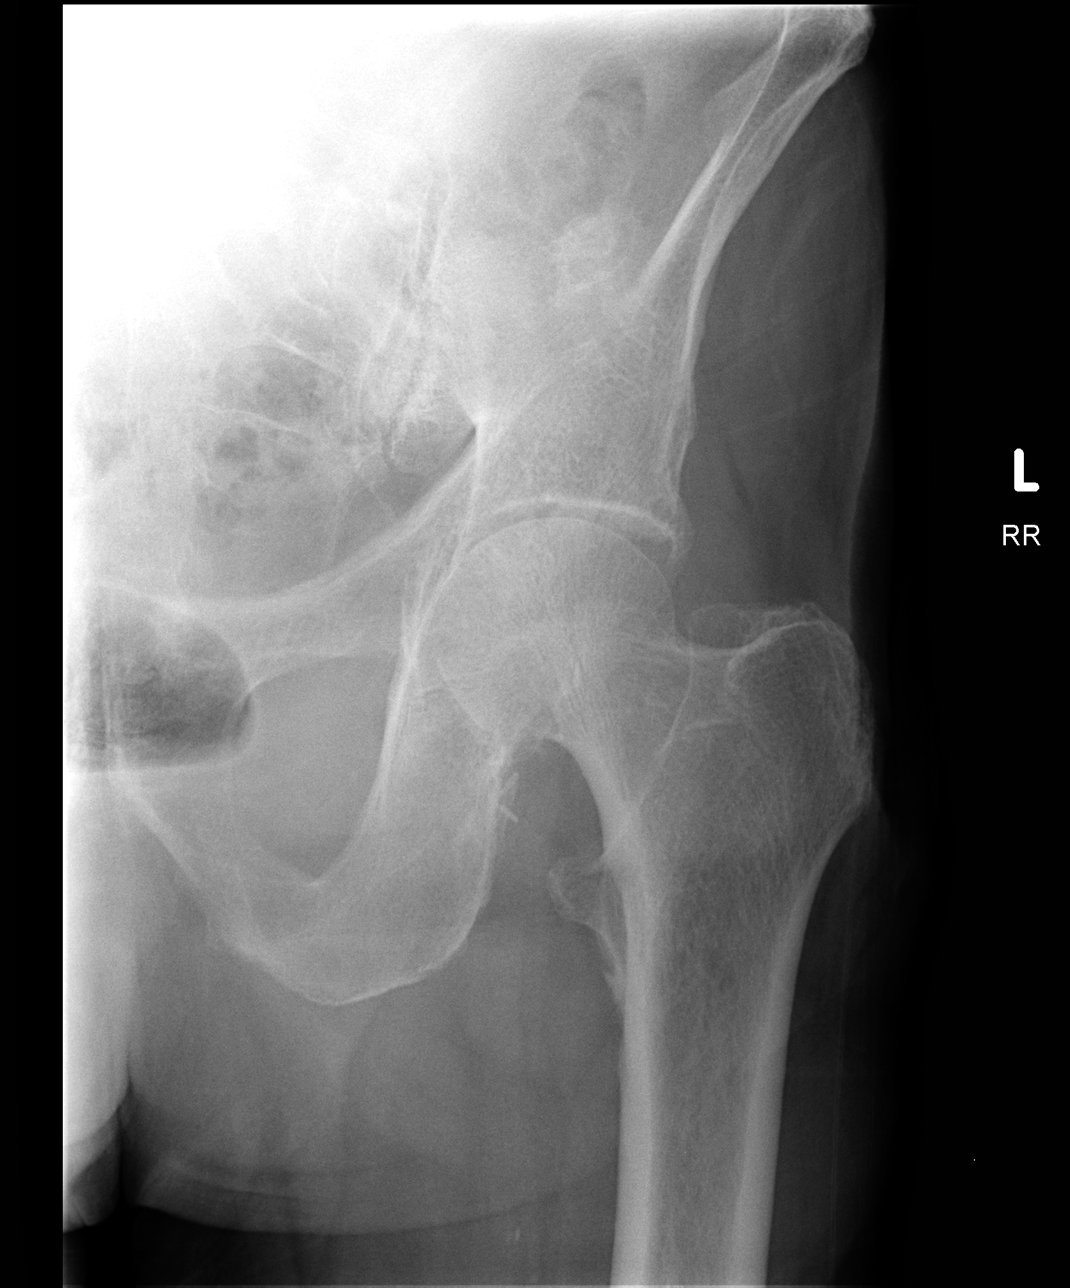

[view not recorded (3 of 4)]
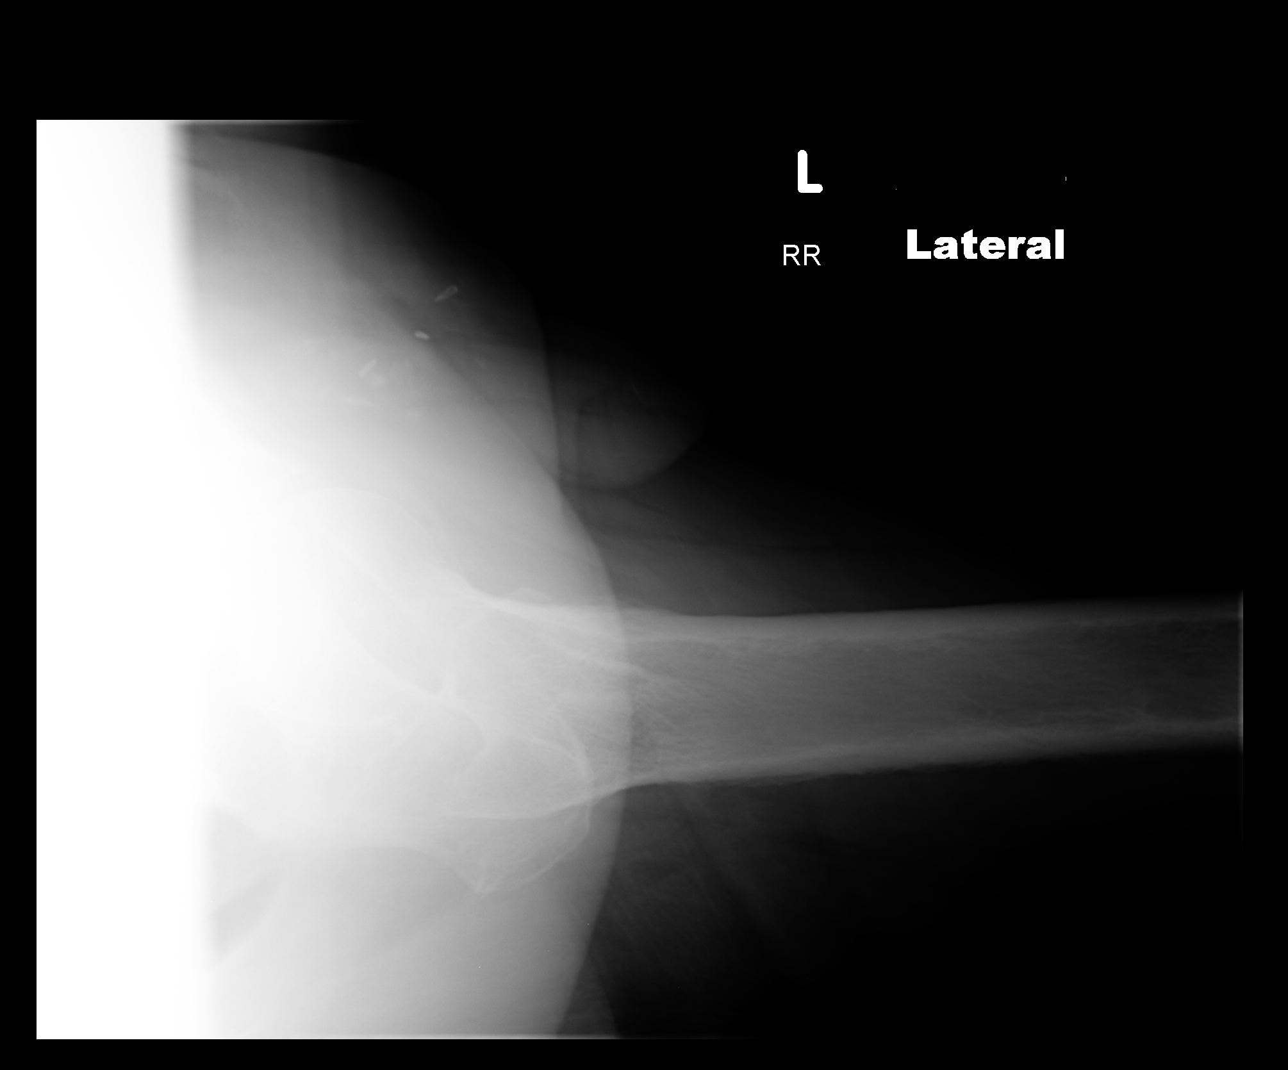

[view not recorded (4 of 4)]
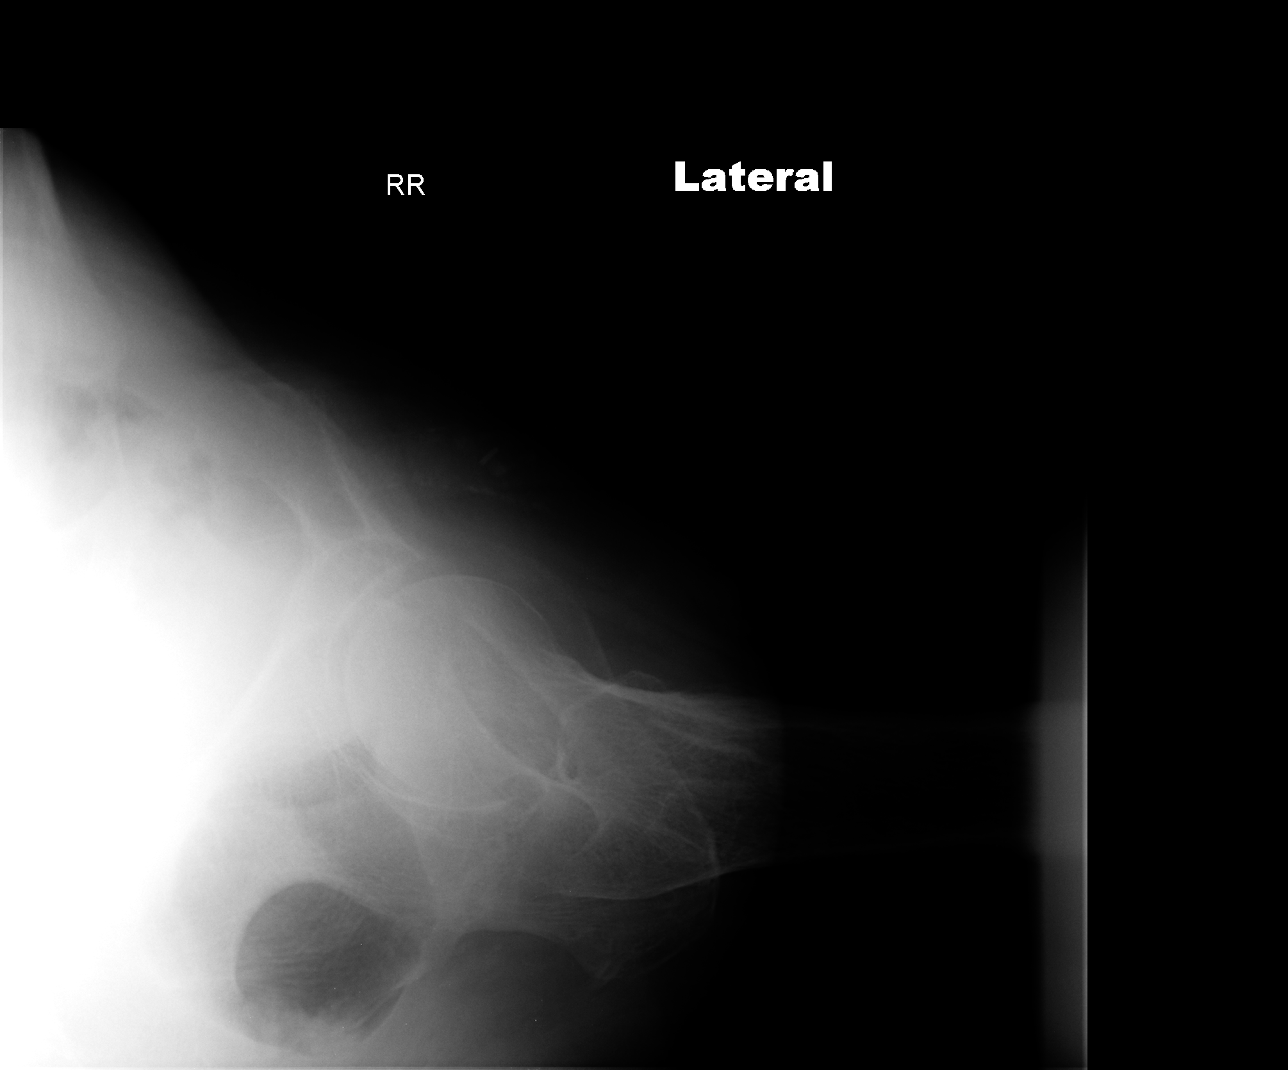

[4 of 4 positions shown; findings below may reference images not displayed]

FINDINGS: Frontal pelvis as well as frontal and lateral left hip images were
obtained. There is moderate symmetric narrowing of both hip joints.
There is no appreciable fracture or dislocation. No erosive change.
There is postoperative change in each inguinal region.
IMPRESSION: Moderate symmetric narrowing of both hip joints. No fracture or
dislocation is appreciable on this study.

## 2017-10-07 DEATH — deceased
